# Patient Record
Sex: Male | Born: 1960 | Race: White | Hispanic: No | Marital: Married | Smoking: Former smoker
Health system: Southern US, Community
[De-identification: ages and names within clinical notes are randomized; demographics above are authoritative.]

## PROBLEM LIST (undated history)

## (undated) DIAGNOSIS — K219 Gastro-esophageal reflux disease without esophagitis: Secondary | ICD-10-CM

## (undated) DIAGNOSIS — K5190 Ulcerative colitis, unspecified, without complications: Secondary | ICD-10-CM

## (undated) DIAGNOSIS — I2510 Atherosclerotic heart disease of native coronary artery without angina pectoris: Secondary | ICD-10-CM

## (undated) DIAGNOSIS — I10 Essential (primary) hypertension: Secondary | ICD-10-CM

## (undated) HISTORY — DX: Ulcerative colitis, unspecified, without complications: K51.90

## (undated) HISTORY — PX: OTHER SURGICAL HISTORY: SHX169

## (undated) HISTORY — DX: Essential (primary) hypertension: I10

---

## 2002-01-23 ENCOUNTER — Ambulatory Visit (HOSPITAL_COMMUNITY): Admission: RE | Admit: 2002-01-23 | Discharge: 2002-01-23 | Payer: Self-pay | Admitting: Gastroenterology

## 2004-09-17 ENCOUNTER — Emergency Department (HOSPITAL_COMMUNITY): Admission: EM | Admit: 2004-09-17 | Discharge: 2004-09-17 | Payer: Self-pay | Admitting: Emergency Medicine

## 2005-06-29 ENCOUNTER — Ambulatory Visit (HOSPITAL_COMMUNITY)
Admission: RE | Admit: 2005-06-29 | Discharge: 2005-06-29 | Payer: Self-pay | Admitting: Physical Medicine and Rehabilitation

## 2013-11-27 ENCOUNTER — Other Ambulatory Visit: Payer: Self-pay | Admitting: Dermatology

## 2014-03-19 ENCOUNTER — Other Ambulatory Visit: Payer: Self-pay | Admitting: Family Medicine

## 2014-03-19 DIAGNOSIS — F17200 Nicotine dependence, unspecified, uncomplicated: Secondary | ICD-10-CM

## 2014-05-01 ENCOUNTER — Encounter: Payer: Self-pay | Admitting: Cardiology

## 2014-05-01 ENCOUNTER — Ambulatory Visit (INDEPENDENT_AMBULATORY_CARE_PROVIDER_SITE_OTHER): Payer: BC Managed Care – PPO | Admitting: Cardiology

## 2014-05-01 VITALS — BP 110/72 | HR 77 | Ht 70.0 in | Wt 247.0 lb

## 2014-05-01 DIAGNOSIS — K5190 Ulcerative colitis, unspecified, without complications: Secondary | ICD-10-CM

## 2014-05-01 DIAGNOSIS — R079 Chest pain, unspecified: Secondary | ICD-10-CM

## 2014-05-01 DIAGNOSIS — I10 Essential (primary) hypertension: Secondary | ICD-10-CM

## 2014-05-01 DIAGNOSIS — E7800 Pure hypercholesterolemia, unspecified: Secondary | ICD-10-CM

## 2014-05-01 DIAGNOSIS — E669 Obesity, unspecified: Secondary | ICD-10-CM

## 2014-05-01 NOTE — Progress Notes (Signed)
[place] [address], [address] [place], [place]  [address] Phone: [phone] Fax:  [phone]  Date:  05/01/2014   ID:  [place], DOB 07-Sep-1960, MRN [id number]  PCP:  Gennette Pac, MD   History of Present Illness: [place] is a 53 y.o. male right previously seen on 12/22/2009 with nuclear stress test that was low risk with no ischemia here for evaluation of chest pain at the request of Dr. Rex Kras. He has a multitude of cardiac risk factors including smoking, hypertension, hyperlipidemia. He has ulcerative colitis. In review of [facility] notes, he was recently dismissed from the GI practice, Dr. Cristina Gong, for noncompliance.  Previously, he was describing chest discomfort that radiated to his jaw. Also associated with some anxiety. Currently, he is describing occasional central chest pressure/tightness sometimes associated with burping but not always. When performing heavy exertional activity sometimes he feels central chest tightness. He also notes that he has been feeling some awkward dizziness. He is battled with vertigo occasionally in the past. He is concerned about this. The dizziness does not necessarily go hand in hand with chest pain. He states that he went to [employer] and recently felt some of the dizziness. He did ride the new roller coaster 3 times however.  Wt Readings from Last 3 Encounters:  05/01/14 247 lb (112.038 kg)     Past medical history: Depression and anxiety Ulcerative colitis in adenomatous colon polyps Tobacco use Hypertension Basal cell carcinoma of the face removed by Dr. Sarajane Jews in 2015 Alcohol use.  Past Surgical History  Procedure Laterality Date  . Skin cancer removal from right side of face      Current Outpatient Prescriptions  Medication Sig Dispense Refill  . Ascorbic Acid (VITAMIN C PO) Take 1 tablet by mouth daily.      Marland Kitchen atorvastatin (LIPITOR) 20 MG tablet Take 20 mg by mouth daily.      Marland Kitchen b complex vitamins capsule Take 1  capsule by mouth daily.      Marland Kitchen buPROPion (WELLBUTRIN XL) 300 MG 24 hr tablet Take 300 mg by mouth daily.      Marland Kitchen losartan-hydrochlorothiazide (HYZAAR) 50-12.5 MG per tablet Take 1 tablet by mouth daily.      . Multiple Vitamin (MULTIVITAMIN) capsule Take 1 capsule by mouth daily.      . Omega-3 Fatty Acids (OMEGA 3 PO) Take by mouth.      Marland Kitchen omeprazole (PRILOSEC) 20 MG capsule Take 20 mg by mouth daily.      . sertraline (ZOLOFT) 100 MG tablet Take 100 mg by mouth daily.       No current facility-administered medications for this visit.    Allergies:   No Known Allergies  Social History:  The patient  reports that he has quit smoking. He started smoking about 2 months ago. He does not have any smokeless tobacco history on file.  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 Family History  Problem Relation Age of Onset  . Arthritis/Rheumatoid Mother   . Liver disease Father   . Lung cancer Paternal Grandmother    His father died of pancreatic cancer status post Whipple procedure, no early family history of coronary artery disease. ROS:  Please see the history of present illness.   Denies any bleeding, syncope, fevers, chills, orthopnea, PND.   All other systems reviewed and negative.   PHYSICAL EXAM: VS:  BP 110/72  Pulse  77  Ht 5' 10"  (1.778 m)  Wt 247 lb (112.038 kg)  BMI 35.44 kg/m2 Well nourished, well developed, in no acute distress HEENT: normal, [place]/AT, EOMI Neck: no JVD, normal carotid upstroke, no bruit Cardiac:  normal S1, S2; RRR; no murmur Lungs:  clear to auscultation bilaterally, no wheezing, rhonchi or rales Abd: soft, nontender, no hepatomegaly, no bruitsoverweight Ext: no edema, 2+ distal pulses Skin: warm and dry GU: deferred Neuro: no focal abnormalities noted, AAO x 3  EKG:  03/18/14-sinus rhythm, no other abnormalities. 04/27/14-sinus rhythm, no other allergies-no change from prior Nuclear stress test: 12/27/09-no  ischemia, low risk, normal ejection fraction of 65% Labs: Hemoglobin A1c 5.4, hemoglobin 15.2, creatinine 1.4, TSH 2.2, LDL 134, triglycerides 422, previous triglycerides 126 with LDL of 127     ASSESSMENT AND PLAN:  1. Chest pain-previous evaluation was over 4 years ago. His multitude of cardiac risk factors including smoking (he states that he just quit after seeing Dr. Rex Kras), hypertension, hyperlipidemia we will proceed with nuclear stress test. 2. Hypertension-overall reasonable control. Doing well. 3. Hyperlipidemia-atorvastatin, monitored by Dr. Rex Kras. 4. Obesity-continue to encourage weight loss. 5. Ulcerative colitis-establishing with new gastroenterologist. 6. We will followup with stress test  Signed, Candee Furbish, MD [facility]  05/01/2014 10:11 AM

## 2014-05-01 NOTE — Patient Instructions (Signed)
The current medical regimen is effective;  continue present plan and medications.  Your physician has requested that you have a myoview. For further information please visit [email or web address]. Please follow instruction sheet, as given.  Follow up as needed after testing.

## 2014-05-07 ENCOUNTER — Encounter (HOSPITAL_COMMUNITY): Payer: Self-pay | Admitting: Pharmacy Technician

## 2014-05-07 ENCOUNTER — Other Ambulatory Visit: Payer: Self-pay

## 2014-05-07 ENCOUNTER — Other Ambulatory Visit (INDEPENDENT_AMBULATORY_CARE_PROVIDER_SITE_OTHER): Payer: BC Managed Care – PPO

## 2014-05-07 ENCOUNTER — Ambulatory Visit (HOSPITAL_COMMUNITY): Payer: BC Managed Care – PPO | Attending: Cardiology | Admitting: Radiology

## 2014-05-07 VITALS — BP 116/77 | Ht 70.0 in | Wt 246.0 lb

## 2014-05-07 DIAGNOSIS — R5383 Other fatigue: Secondary | ICD-10-CM

## 2014-05-07 DIAGNOSIS — I2589 Other forms of chronic ischemic heart disease: Secondary | ICD-10-CM | POA: Insufficient documentation

## 2014-05-07 DIAGNOSIS — R079 Chest pain, unspecified: Secondary | ICD-10-CM

## 2014-05-07 DIAGNOSIS — R9439 Abnormal result of other cardiovascular function study: Secondary | ICD-10-CM | POA: Diagnosis not present

## 2014-05-07 DIAGNOSIS — R5381 Other malaise: Secondary | ICD-10-CM | POA: Diagnosis not present

## 2014-05-07 DIAGNOSIS — I10 Essential (primary) hypertension: Secondary | ICD-10-CM | POA: Insufficient documentation

## 2014-05-07 DIAGNOSIS — R0609 Other forms of dyspnea: Secondary | ICD-10-CM | POA: Insufficient documentation

## 2014-05-07 DIAGNOSIS — R0989 Other specified symptoms and signs involving the circulatory and respiratory systems: Secondary | ICD-10-CM | POA: Insufficient documentation

## 2014-05-07 DIAGNOSIS — R42 Dizziness and giddiness: Secondary | ICD-10-CM | POA: Insufficient documentation

## 2014-05-07 DIAGNOSIS — R0789 Other chest pain: Secondary | ICD-10-CM | POA: Diagnosis present

## 2014-05-07 DIAGNOSIS — R0602 Shortness of breath: Secondary | ICD-10-CM

## 2014-05-07 LAB — BASIC METABOLIC PANEL
BUN: 17 mg/dL (ref 6–23)
CO2: 27 mEq/L (ref 19–32)
Calcium: 9.4 mg/dL (ref 8.4–10.5)
Chloride: 101 mEq/L (ref 96–112)
Creatinine, Ser: 1 mg/dL (ref 0.4–1.5)
GFR: 78.95 mL/min (ref >60.00)
Glucose, Bld: 96 mg/dL (ref 70–99)
Potassium: 3.9 mEq/L (ref 3.5–5.1)
Sodium: 137 mEq/L (ref 135–145)

## 2014-05-07 LAB — CBC
HCT: 43.8 % (ref 39.0–52.0)
Hemoglobin: 14.9 g/dL (ref 13.0–17.0)
MCHC: 34 g/dL (ref 30.0–36.0)
MCV: 90 fl (ref 78.0–100.0)
Platelets: 182 10*3/uL (ref 150.0–400.0)
RBC: 4.86 Mil/uL (ref 4.22–5.81)
RDW: 12.4 % (ref 11.5–15.5)
WBC: 7.5 10*3/uL (ref 4.0–10.5)

## 2014-05-07 LAB — PROTIME-INR
INR: 1 ratio (ref 0.8–1.0)
Prothrombin Time: 10.6 s (ref 9.6–13.1)

## 2014-05-07 MED ORDER — TECHNETIUM TC 99M SESTAMIBI GENERIC - CARDIOLITE
10.0000 | Freq: Once | INTRAVENOUS | Status: AC | PRN
  Administered 2014-05-07: 10 via INTRAVENOUS

## 2014-05-07 MED ORDER — TECHNETIUM TC 99M SESTAMIBI GENERIC - CARDIOLITE
30.0000 | Freq: Once | INTRAVENOUS | Status: AC | PRN
  Administered 2014-05-07: 30 via INTRAVENOUS

## 2014-05-07 NOTE — Progress Notes (Signed)
[facility] [address] [place], [place] [address] [phone]    Cardiology Nuclear Med Study  [place] is a 53 y.o. male     MRN : [id number]     DOB: [birth year]  Procedure Date: 05/07/2014  Nuclear Med Background Indication for Stress Test:  Evaluation for Ischemia History:  Previous Nuclear Study @ [facility] (low risk) Cardiac Risk Factors: Hypertension  Symptoms:  Chest Pressure.  (last date of chest discomfort 5 days ago), Dizziness, DOE and Fatigue   Nuclear Pre-Procedure Caffeine/Decaff Intake:  None NPO After: 7:00pm   Lungs:  clear O2 Sat: 94% on room air. IV 0.9% NS with Angio Cath:  22g  IV Site: R Hand  IV Started by:  Crissie Figures, RN  Chest Size (in):  48 Cup Size: n/a  Height: 5' 10"  (1.778 m)  Weight:  246 lb (111.585 kg)  BMI:  Body mass index is 35.3 kg/(m^2). Tech Comments:  N/A    Nuclear Med Study 1 or 2 day study: 1 day  Stress Test Type:  Stress  Reading MD: N/A  Order Authorizing Provider:  Candee Furbish, MD  Resting Radionuclide: Technetium 33mSestamibi  Resting Radionuclide Dose: 11.0 mCi   Stress Radionuclide:  Technetium 927mestamibi  Stress Radionuclide Dose: 33.0 mCi           Stress Protocol Rest HR: 67 Stress HR: 148  Rest BP: 116/77 Stress BP: 152/74  Exercise Time (min): 7:30 METS: 9.30   Predicted Max HR: 166 bpm % Max HR: 89.16 bpm Rate Pressure Product: 22496   Dose of Adenosine (mg):  n/a Dose of Lexiscan: n/a mg  Dose of Atropine (mg): n/a Dose of Dobutamine: n/a mcg/kg/min (at max HR)  Stress Test Technologist: TeIleene HutchinsonEMT-P  Nuclear Technologist:  ToAnnye RuskCNMT     Rest Procedure:  Myocardial perfusion imaging was performed at rest 45 minutes following the intravenous administration of Technetium 9956mstamibi. Rest ECG: NSR - Normal EKG  Stress Procedure:  The patient exercised on the treadmill utilizing the Bruce Protocol for 7:30 minutes. The patient stopped due to  sob/fatigue. Pt had epigastric discomfort/heaviness. During recovery, pt stated he did experience jaw pain. All symptoms subsided within minutes of recovery.  Technetium 71m95mtamibi was injected at peak exercise and myocardial perfusion imaging was performed after a brief delay. Stress ECG: There is ST segment depression in the inferior leads as well as lateral precordial leads of 1-2 mm. Abnormal. Elevation in aVR.  QPS Raw Data Images:  Normal; no motion artifact; normal heart/lung ratio. Stress Images:  There is a moderate sized defect along the mid to distal anteroseptal wall/apical distribution seen at stress, normal at rest consistent with ischemia. Defect is moderate in intensity. Rest Images:  Normal homogeneous uptake in all areas of the myocardium. Subtraction (SDS):  12 Transient Ischemic Dilatation (Normal <1.22):  1.14 Lung/Heart Ratio (Normal <0.45):  0.49  Quantitative Gated Spect Images QGS EDV:  106 ml QGS ESV:  54 ml  Impression Exercise Capacity:  Good exercise capacity. BP Response:  Normal blood pressure response. Clinical Symptoms:  Chest pain, shortness of breath, heaviness with associated jaw pain at peak stress slowly becoming symptom-free during recovery. ECG Impression:  Significant ST abnormalities consistent with ischemia. Comparison with Prior Nuclear Study: No images to compare  Overall Impression:  High risk stress nuclear study With anteroseptal wall ischemia concerning for disease in his left anterior descending artery, mildly reduced ejection  fraction..  LV Ejection Fraction: 49%.  LV Wall Motion:  Hypokinesis/dyskinesis of the apical/inferoapical/distal anteroseptal region.  Findings have been discussed with patient, proceeding with heart catheterization. Orders for catheterization have been placed.  Candee Furbish, MD

## 2014-05-08 ENCOUNTER — Encounter (HOSPITAL_COMMUNITY): Payer: Self-pay | Admitting: General Practice

## 2014-05-08 ENCOUNTER — Ambulatory Visit (HOSPITAL_COMMUNITY)
Admission: RE | Admit: 2014-05-08 | Discharge: 2014-05-09 | Disposition: A | Discharge status: Home / Self Care | Payer: BC Managed Care – PPO | Source: Ambulatory Visit | Attending: Interventional Cardiology | Admitting: Interventional Cardiology

## 2014-05-08 ENCOUNTER — Encounter (HOSPITAL_COMMUNITY)
Admission: RE | Disposition: A | Discharge status: Home / Self Care | Payer: BC Managed Care – PPO | Source: Ambulatory Visit | Attending: Interventional Cardiology

## 2014-05-08 DIAGNOSIS — Z6835 Body mass index (BMI) 35.0-35.9, adult: Secondary | ICD-10-CM | POA: Diagnosis not present

## 2014-05-08 DIAGNOSIS — F3289 Other specified depressive episodes: Secondary | ICD-10-CM | POA: Diagnosis not present

## 2014-05-08 DIAGNOSIS — Z85828 Personal history of other malignant neoplasm of skin: Secondary | ICD-10-CM | POA: Insufficient documentation

## 2014-05-08 DIAGNOSIS — E669 Obesity, unspecified: Secondary | ICD-10-CM | POA: Diagnosis not present

## 2014-05-08 DIAGNOSIS — I200 Unstable angina: Secondary | ICD-10-CM | POA: Diagnosis not present

## 2014-05-08 DIAGNOSIS — Z955 Presence of coronary angioplasty implant and graft: Secondary | ICD-10-CM

## 2014-05-08 DIAGNOSIS — I25119 Atherosclerotic heart disease of native coronary artery with unspecified angina pectoris: Secondary | ICD-10-CM

## 2014-05-08 DIAGNOSIS — I2510 Atherosclerotic heart disease of native coronary artery without angina pectoris: Secondary | ICD-10-CM | POA: Diagnosis not present

## 2014-05-08 DIAGNOSIS — E7800 Pure hypercholesterolemia, unspecified: Secondary | ICD-10-CM | POA: Diagnosis present

## 2014-05-08 DIAGNOSIS — E785 Hyperlipidemia, unspecified: Secondary | ICD-10-CM | POA: Insufficient documentation

## 2014-05-08 DIAGNOSIS — F411 Generalized anxiety disorder: Secondary | ICD-10-CM | POA: Diagnosis not present

## 2014-05-08 DIAGNOSIS — I10 Essential (primary) hypertension: Secondary | ICD-10-CM | POA: Diagnosis present

## 2014-05-08 DIAGNOSIS — R079 Chest pain, unspecified: Secondary | ICD-10-CM | POA: Diagnosis present

## 2014-05-08 DIAGNOSIS — Z23 Encounter for immunization: Secondary | ICD-10-CM | POA: Insufficient documentation

## 2014-05-08 DIAGNOSIS — F17200 Nicotine dependence, unspecified, uncomplicated: Secondary | ICD-10-CM | POA: Diagnosis not present

## 2014-05-08 DIAGNOSIS — K5190 Ulcerative colitis, unspecified, without complications: Secondary | ICD-10-CM | POA: Insufficient documentation

## 2014-05-08 DIAGNOSIS — I209 Angina pectoris, unspecified: Secondary | ICD-10-CM | POA: Diagnosis present

## 2014-05-08 DIAGNOSIS — F329 Major depressive disorder, single episode, unspecified: Secondary | ICD-10-CM | POA: Diagnosis not present

## 2014-05-08 HISTORY — PX: CORONARY STENT PLACEMENT: SHX1402

## 2014-05-08 HISTORY — PX: LEFT HEART CATHETERIZATION WITH CORONARY ANGIOGRAM: SHX5451

## 2014-05-08 HISTORY — PX: PERCUTANEOUS CORONARY STENT INTERVENTION (PCI-S): SHX5485

## 2014-05-08 HISTORY — DX: Atherosclerotic heart disease of native coronary artery without angina pectoris: I25.10

## 2014-05-08 HISTORY — DX: Gastro-esophageal reflux disease without esophagitis: K21.9

## 2014-05-08 LAB — CK TOTAL AND CKMB (NOT AT ARMC)
CK, MB: 2.4 ng/mL (ref 0.3–4.0)
Relative Index: 1.9 (ref 0.0–2.5)
Total CK: 126 U/L (ref 7–232)

## 2014-05-08 SURGERY — LEFT HEART CATHETERIZATION WITH CORONARY ANGIOGRAM
Anesthesia: LOCAL

## 2014-05-08 MED ORDER — ADULT MULTIVITAMIN W/MINERALS CH
1.0000 | ORAL_TABLET | Freq: Every day | ORAL | Status: DC
  Administered 2014-05-09: 09:00:00 1 via ORAL
  Filled 2014-05-08: qty 1

## 2014-05-08 MED ORDER — LOSARTAN POTASSIUM-HCTZ 50-12.5 MG PO TABS: 1.0000 | ORAL_TABLET | Freq: Every day | ORAL | Status: DC

## 2014-05-08 MED ORDER — NITROGLYCERIN 1 MG/10 ML FOR IR/CATH LAB
INTRA_ARTERIAL | Status: AC
  Filled 2014-05-08: qty 10

## 2014-05-08 MED ORDER — SERTRALINE HCL 100 MG PO TABS
100.0000 mg | ORAL_TABLET | Freq: Two times a day (BID) | ORAL | Status: DC
Start: 2014-05-08 — End: 2014-05-09
  Administered 2014-05-08 – 2014-05-09 (×2): 100 mg via ORAL
  Filled 2014-05-08 (×3): qty 1

## 2014-05-08 MED ORDER — HEPARIN (PORCINE) IN NACL 2-0.9 UNIT/ML-% IJ SOLN
INTRAMUSCULAR | Status: AC
  Filled 2014-05-08: qty 1000

## 2014-05-08 MED ORDER — ASPIRIN 81 MG PO CHEW
81.0000 mg | CHEWABLE_TABLET | Freq: Every day | ORAL | Status: DC
  Administered 2014-05-09: 09:00:00 81 mg via ORAL
  Filled 2014-05-08: qty 1

## 2014-05-08 MED ORDER — SODIUM CHLORIDE 0.9 % IV SOLN
INTRAVENOUS | Status: DC
  Administered 2014-05-08: 10:00:00 via INTRAVENOUS

## 2014-05-08 MED ORDER — TICAGRELOR 90 MG PO TABS
ORAL_TABLET | ORAL | Status: AC
Start: 2014-05-08 — End: 2014-05-08
  Filled 2014-05-08: qty 2

## 2014-05-08 MED ORDER — PANTOPRAZOLE SODIUM 40 MG PO TBEC
40.0000 mg | DELAYED_RELEASE_TABLET | Freq: Every day | ORAL | Status: DC
  Administered 2014-05-09: 40 mg via ORAL
  Filled 2014-05-08 (×2): qty 1

## 2014-05-08 MED ORDER — BUPROPION HCL ER (XL) 300 MG PO TB24
300.0000 mg | ORAL_TABLET | Freq: Every day | ORAL | Status: DC
  Administered 2014-05-09: 09:00:00 300 mg via ORAL
  Filled 2014-05-08: qty 1

## 2014-05-08 MED ORDER — MULTIVITAMINS PO CAPS: 1.0000 | ORAL_CAPSULE | Freq: Every day | ORAL | Status: DC

## 2014-05-08 MED ORDER — LOSARTAN POTASSIUM 50 MG PO TABS
50.0000 mg | ORAL_TABLET | Freq: Every day | ORAL | Status: DC
  Administered 2014-05-09: 09:00:00 50 mg via ORAL
  Filled 2014-05-08 (×2): qty 1

## 2014-05-08 MED ORDER — BIVALIRUDIN 250 MG IV SOLR
INTRAVENOUS | Status: AC
  Filled 2014-05-08: qty 250

## 2014-05-08 MED ORDER — SODIUM CHLORIDE 0.9 % IV SOLN: 1.0000 mL/kg/h | INTRAVENOUS | Status: AC

## 2014-05-08 MED ORDER — ASPIRIN 81 MG PO CHEW
CHEWABLE_TABLET | ORAL | Status: AC
  Administered 2014-05-08: 81 mg via ORAL
  Filled 2014-05-08: qty 1

## 2014-05-08 MED ORDER — MIDAZOLAM HCL 2 MG/2ML IJ SOLN
INTRAMUSCULAR | Status: AC
Start: 2014-05-08 — End: 2014-05-08
  Filled 2014-05-08: qty 2

## 2014-05-08 MED ORDER — ATORVASTATIN CALCIUM 40 MG PO TABS
40.0000 mg | ORAL_TABLET | Freq: Every day | ORAL | Status: DC
  Administered 2014-05-09: 40 mg via ORAL
  Filled 2014-05-08: qty 1

## 2014-05-08 MED ORDER — ACETAMINOPHEN 325 MG PO TABS: 650.0000 mg | ORAL_TABLET | ORAL | Status: DC | PRN

## 2014-05-08 MED ORDER — FENTANYL CITRATE 0.05 MG/ML IJ SOLN
INTRAMUSCULAR | Status: AC
  Filled 2014-05-08: qty 2

## 2014-05-08 MED ORDER — ASPIRIN 81 MG PO CHEW
81.0000 mg | CHEWABLE_TABLET | ORAL | Status: AC
  Administered 2014-05-08: 81 mg via ORAL

## 2014-05-08 MED ORDER — OMEGA 3 1000 MG PO CAPS: 1000.0000 mg | ORAL_CAPSULE | Freq: Every day | ORAL | Status: DC

## 2014-05-08 MED ORDER — TICAGRELOR 90 MG PO TABS
90.0000 mg | ORAL_TABLET | Freq: Two times a day (BID) | ORAL | Status: DC
  Administered 2014-05-08 – 2014-05-09 (×2): 90 mg via ORAL
  Filled 2014-05-08 (×3): qty 1

## 2014-05-08 MED ORDER — SODIUM CHLORIDE 0.9 % IJ SOLN: 3.0000 mL | INTRAMUSCULAR | Status: DC | PRN

## 2014-05-08 MED ORDER — PNEUMOCOCCAL VAC POLYVALENT 25 MCG/0.5ML IJ INJ
0.5000 mL | INJECTION | Freq: Once | INTRAMUSCULAR | Status: AC
Start: 2014-05-08 — End: 2014-05-09
  Administered 2014-05-09: 0.5 mL via INTRAMUSCULAR
  Filled 2014-05-08: qty 0.5

## 2014-05-08 MED ORDER — MIDAZOLAM HCL 2 MG/2ML IJ SOLN
INTRAMUSCULAR | Status: AC
  Filled 2014-05-08: qty 2

## 2014-05-08 MED ORDER — SODIUM CHLORIDE 0.9 % IJ SOLN: 3.0000 mL | Freq: Two times a day (BID) | INTRAMUSCULAR | Status: DC

## 2014-05-08 MED ORDER — HYDROCHLOROTHIAZIDE 12.5 MG PO CAPS
12.5000 mg | ORAL_CAPSULE | Freq: Every day | ORAL | Status: DC
  Administered 2014-05-09: 09:00:00 12.5 mg via ORAL
  Filled 2014-05-08 (×2): qty 1

## 2014-05-08 MED ORDER — HEPARIN SODIUM (PORCINE) 1000 UNIT/ML IJ SOLN
INTRAMUSCULAR | Status: AC
  Filled 2014-05-08: qty 1

## 2014-05-08 MED ORDER — OMEGA-3-ACID ETHYL ESTERS 1 G PO CAPS
1.0000 g | ORAL_CAPSULE | Freq: Every day | ORAL | Status: DC
Start: 2014-05-08 — End: 2014-05-09
  Administered 2014-05-09: 1 g via ORAL
  Filled 2014-05-08 (×2): qty 1

## 2014-05-08 MED ORDER — VERAPAMIL HCL 2.5 MG/ML IV SOLN
INTRAVENOUS | Status: AC
  Filled 2014-05-08: qty 2

## 2014-05-08 MED ORDER — ONDANSETRON HCL 4 MG/2ML IJ SOLN: 4.0000 mg | Freq: Four times a day (QID) | INTRAMUSCULAR | Status: DC | PRN

## 2014-05-08 MED ORDER — SODIUM CHLORIDE 0.9 % IV SOLN: 250.0000 mL | INTRAVENOUS | Status: DC | PRN

## 2014-05-08 MED ORDER — LIDOCAINE HCL (PF) 1 % IJ SOLN
INTRAMUSCULAR | Status: AC
  Filled 2014-05-08: qty 30

## 2014-05-08 NOTE — Progress Notes (Signed)
TR BAND REMOVAL  LOCATION:  right radial  DEFLATED PER PROTOCOL:  Yes.    TIME BAND OFF / DRESSING APPLIED:   1615   SITE UPON ARRIVAL:   Level 0  SITE AFTER BAND REMOVAL:  Level 0  REVERSE ALLEN'S TEST:    positive  CIRCULATION SENSATION AND MOVEMENT:  Within Normal Limits  Yes.    COMMENTS:

## 2014-05-08 NOTE — Interval H&P Note (Signed)
History and Physical Interval Note:  05/08/2014 11:05 AM  [place]  has presented today for surgery, with the diagnosis of cp/abnormal mioview  The various methods of treatment have been discussed with the patient and family. After consideration of risks, benefits and other options for treatment, the patient has consented to  Procedure(s): LEFT HEART CATHETERIZATION WITH CORONARY ANGIOGRAM (N/A) as a surgical intervention .  The patient's history has been reviewed, patient examined, no change in status, stable for surgery.  I have reviewed the patient's chart and labs.  Questions were answered to the patient's satisfaction.     SKAINS, MARK

## 2014-05-08 NOTE — H&P (View-Only) (Signed)
[facility] [address] [place], [place] [address] [phone]    Cardiology Nuclear Med Study  [place] is a 53 y.o. male     MRN : [id number]     DOB: [birth year]  Procedure Date: 05/07/2014  Nuclear Med Background Indication for Stress Test:  Evaluation for Ischemia History:  Previous Nuclear Study @ [facility] (low risk) Cardiac Risk Factors: Hypertension  Symptoms:  Chest Pressure.  (last date of chest discomfort 5 days ago), Dizziness, DOE and Fatigue   Nuclear Pre-Procedure Caffeine/Decaff Intake:  None NPO After: 7:00pm   Lungs:  clear O2 Sat: 94% on room air. IV 0.9% NS with Angio Cath:  22g  IV Site: R Hand  IV Started by:  Crissie Figures, RN  Chest Size (in):  48 Cup Size: n/a  Height: 5' 10"  (1.778 m)  Weight:  246 lb (111.585 kg)  BMI:  Body mass index is 35.3 kg/(m^2). Tech Comments:  N/A    Nuclear Med Study 1 or 2 day study: 1 day  Stress Test Type:  Stress  Reading MD: N/A  Order Authorizing Provider:  Candee Furbish, MD  Resting Radionuclide: Technetium 103mSestamibi  Resting Radionuclide Dose: 11.0 mCi   Stress Radionuclide:  Technetium 941mestamibi  Stress Radionuclide Dose: 33.0 mCi           Stress Protocol Rest HR: 67 Stress HR: 148  Rest BP: 116/77 Stress BP: 152/74  Exercise Time (min): 7:30 METS: 9.30   Predicted Max HR: 166 bpm % Max HR: 89.16 bpm Rate Pressure Product: 22496   Dose of Adenosine (mg):  n/a Dose of Lexiscan: n/a mg  Dose of Atropine (mg): n/a Dose of Dobutamine: n/a mcg/kg/min (at max HR)  Stress Test Technologist: TeIleene HutchinsonEMT-P  Nuclear Technologist:  ToAnnye RuskCNMT     Rest Procedure:  Myocardial perfusion imaging was performed at rest 45 minutes following the intravenous administration of Technetium 9978mstamibi. Rest ECG: NSR - Normal EKG  Stress Procedure:  The patient exercised on the treadmill utilizing the Bruce Protocol for 7:30 minutes. The patient stopped due to  sob/fatigue. Pt had epigastric discomfort/heaviness. During recovery, pt stated he did experience jaw pain. All symptoms subsided within minutes of recovery.  Technetium 4m9mtamibi was injected at peak exercise and myocardial perfusion imaging was performed after a brief delay. Stress ECG: There is ST segment depression in the inferior leads as well as lateral precordial leads of 1-2 mm. Abnormal. Elevation in aVR.  QPS Raw Data Images:  Normal; no motion artifact; normal heart/lung ratio. Stress Images:  There is a moderate sized defect along the mid to distal anteroseptal wall/apical distribution seen at stress, normal at rest consistent with ischemia. Defect is moderate in intensity. Rest Images:  Normal homogeneous uptake in all areas of the myocardium. Subtraction (SDS):  12 Transient Ischemic Dilatation (Normal <1.22):  1.14 Lung/Heart Ratio (Normal <0.45):  0.49  Quantitative Gated Spect Images QGS EDV:  106 ml QGS ESV:  54 ml  Impression Exercise Capacity:  Good exercise capacity. BP Response:  Normal blood pressure response. Clinical Symptoms:  Chest pain, shortness of breath, heaviness with associated jaw pain at peak stress slowly becoming symptom-free during recovery. ECG Impression:  Significant ST abnormalities consistent with ischemia. Comparison with Prior Nuclear Study: No images to compare  Overall Impression:  High risk stress nuclear study With anteroseptal wall ischemia concerning for disease in his left anterior descending artery, mildly reduced ejection  fraction..  LV Ejection Fraction: 49%.  LV Wall Motion:  Hypokinesis/dyskinesis of the apical/inferoapical/distal anteroseptal region.  Findings have been discussed with patient, proceeding with heart catheterization. Orders for catheterization have been placed.  Candee Furbish, MD

## 2014-05-08 NOTE — CV Procedure (Signed)
    CARDIAC CATH NOTE  Name: ADARRIUS GRAEFF MRN: [id number] DOB: 1961/02/21  Procedure: PTCA and stenting of the proximal LAD  Indication: 53 yo WM with recent chest pain and abnormal stress test. Diagnostic study showed 90-95% proximal LAD stenosis.  Procedural Details: The right wrist access was used. Weight-based bivalirudin was given for anticoagulation. Brilinta 180 mg was given orally. Once a therapeutic ACT was achieved, a 6 [place] XBLAD 3.5 guide catheter was inserted.  A prowater coronary guidewire was used to cross the lesion.  The lesion was predilated with a 2.5 mm balloon. The patient was enrolled in the Bioflow study and randomized to a Xience stent.  The lesion was then stented with a 3.0 x 23 mm Xience stent.  The stent was postdilated with a 3.25 mm noncompliant balloon.  Following PCI, there was 0% residual stenosis and TIMI-3 flow. Final angiography confirmed an excellent result. The patient tolerated the procedure well. There were no immediate procedural complications. A TR band was used for radial hemostasis. The patient was transferred to the post catheterization recovery area for further monitoring.  Lesion Data: Vessel: LAD- proximal. Percent stenosis (pre): 90-95% TIMI-flow (pre):  3 Stent:  3.0 x 23 mm Xience Percent stenosis (post): 0% TIMI-flow (post): 3  Conclusions:  1. Successful stenting of the proximal LAD with a DES.  Recommendations: Continue DAPT for one year. Anticipate DC in am.  Rik Wadel [place], [place] 05/08/2014, 12:34 PM

## 2014-05-08 NOTE — Research (Signed)
BIO-FLOW Informed Consent   Subject Name: [place]  Subject met inclusion and exclusion criteria.  The informed consent form, study requirements and expectations were reviewed with the subject and questions and concerns were addressed prior to the signing of the consent form.  The subject verbalized understanding of the trial requirements.  The subject agreed to participate in the BIO-FLOW trial and signed the informed consent.  The informed consent was obtained prior to performance of any protocol-specific procedures for the subject.  A copy of the signed informed consent was given to the subject and a copy was placed in the subject's medical record.  [name] 05/08/2014, 2:06 PM

## 2014-05-08 NOTE — CV Procedure (Signed)
    CARDIAC CATHETERIZATION  PROCEDURE:  Left heart catheterization with selective coronary angiography, left ventriculogram via the radial artery approach.  INDICATIONS:  53 year old nondiabetic male with obesity, hypertension, hyperlipidemia with significant anginal symptoms and nuclear stress test that was high risk demonstrating ejection fraction of 49% and anterior wall reversible defect consistent with ischemia.  The risks, benefits, and details of the procedure were explained to the patient, including possibilities of stroke, heart attack, death, renal impairment, arterial damage, bleeding.  The patient verbalized understanding and wanted to proceed.  Informed written consent was obtained.  PROCEDURE TECHNIQUE:  Allen's test was performed pre-and post procedure and was normal. The right radial artery site was prepped and draped in a sterile fashion. One percent lidocaine was used for local anesthesia. Using the modified Seldinger technique a 5 French hydrophilic sheath was inserted into the radial artery without difficulty. 3 mg of verapamil was administered via the sheath. There was mild difficulty passing the safety J-wire just distal to the sheath insertion. An angiogram was performed. A Judkins right #4 catheter with the guidance of a Versicore wire was placed in the right coronary cusp and selectively cannulated the right coronary artery. This was passed without difficulty. After traversing the aortic arch, 5000 units of heparin IV was administered. A Judkins left #3.5 catheter was used to selectively cannulate the left main artery. Multiple views with hand injection of Omnipaque were obtained. Catheter a pigtail catheter was used to cross into the left ventricle, hemodynamics were obtained, and a left ventriculogram was performed in the RAO position with power injection. Following the procedure, sheath was removed, patient was hemodynamically stable, hemostasis was maintained with a Terumo T  band.   CONTRAST:  Total of 80 ml.    FLOUROSCOPY TIME: 3.6 min.  COMPLICATIONS:  None.    HEMODYNAMICS:  Aortic pressure was 626/94WNIO; LV systolic pressure was 270JJKK; LVEDP 56mHg.  There was no gradient between the left ventricle and aorta.    ANGIOGRAPHIC DATA:    Left main: No angiographically significant coronary artery disease branches into LAD and circumflex artery  Left anterior descending (LAD): There is proximal LAD disease a stenosis of 95% long, tubular lesion encompassing the first septal branch just up to the first major diagonal branch.  Circumflex artery (CIRC): Small ramus branch minor luminal irregularities. Circumflex artery with one obtuse marginal branch, no significant disease.  Right coronary artery (RCA): Dominant vessel giving rise to the posterior descending artery, no angiographically significant disease.  LEFT VENTRICULOGRAM:  Left ventricular angiogram was done in the 30 RAO projection and revealed mid to distal anterior wall hypokinesis with an estimated ejection fraction of 45%. 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  LVEDP 17 mmHg.  Ejection fraction 45%.  RECOMMENDATION:  Percutaneous intervention to be performed by Dr. Peter JMartinique Case discussed with patient. Understands risks and benefits of procedure including stroke, heart attack, death, renal impairment. He is currently participating in Bio-Flow study.  Post procedure, we will start beta blocker, continue with statin, continue with angiotensin receptor blocker.  SCandee Furbish MD

## 2014-05-08 NOTE — Interval H&P Note (Signed)
History and Physical Interval Note:  05/08/2014 11:04 AM  [place]  has presented today for surgery, with the diagnosis of cp/abnormal mioview  The various methods of treatment have been discussed with the patient and family. After consideration of risks, benefits and other options for treatment, the patient has consented to  Procedure(s): LEFT HEART CATHETERIZATION WITH CORONARY ANGIOGRAM (N/A) as a surgical intervention .  The patient's history has been reviewed, patient examined, no change in status, stable for surgery.  I have reviewed the patient's chart and labs.  Questions were answered to the patient's satisfaction.     [name]

## 2014-05-08 NOTE — H&P (View-Only) (Signed)
[facility]. [address] [place], [place]  [address] Phone: [phone] Fax:  [phone]  Date:  05/01/2014   ID:  Howard Lawrence, DOB 10-01-1960, MRN [id number]  PCP:  Gennette Pac, MD   History of Present Illness: Howard Lawrence is a 53 y.o. male right previously seen on 12/22/2009 with nuclear stress test that was low risk with no ischemia here for evaluation of chest pain at the request of Dr. Rex Kras. He has a multitude of cardiac risk factors including smoking, hypertension, hyperlipidemia. He has ulcerative colitis. In review of [facility] notes, he was recently dismissed from the GI practice, Dr. Cristina Gong, for noncompliance.  Previously, he was describing chest discomfort that radiated to his jaw. Also associated with some anxiety. Currently, he is describing occasional central chest pressure/tightness sometimes associated with burping but not always. When performing heavy exertional activity sometimes he feels central chest tightness. He also notes that he has been feeling some awkward dizziness. He is battled with vertigo occasionally in the past. He is concerned about this. The dizziness does not necessarily go hand in hand with chest pain. He states that he went to [place] and recently felt some of the dizziness. He did ride the new roller coaster 3 times however.  Wt Readings from Last 3 Encounters:  05/01/14 247 lb (112.038 kg)     Past medical history: Depression and anxiety Ulcerative colitis in adenomatous colon polyps Tobacco use Hypertension Basal cell carcinoma of the face removed by Dr. Sarajane Jews in 2015 Alcohol use.  Past Surgical History  Procedure Laterality Date  . Skin cancer removal from right side of face      Current Outpatient Prescriptions  Medication Sig Dispense Refill  . Ascorbic Acid (VITAMIN C PO) Take 1 tablet by mouth daily.      Marland Kitchen atorvastatin (LIPITOR) 20 MG tablet Take 20 mg by mouth daily.      Marland Kitchen b complex vitamins capsule Take 1  capsule by mouth daily.      Marland Kitchen buPROPion (WELLBUTRIN XL) 300 MG 24 hr tablet Take 300 mg by mouth daily.      Marland Kitchen losartan-hydrochlorothiazide (HYZAAR) 50-12.5 MG per tablet Take 1 tablet by mouth daily.      . Multiple Vitamin (MULTIVITAMIN) capsule Take 1 capsule by mouth daily.      . Omega-3 Fatty Acids (OMEGA 3 PO) Take by mouth.      Marland Kitchen omeprazole (PRILOSEC) 20 MG capsule Take 20 mg by mouth daily.      . sertraline (ZOLOFT) 100 MG tablet Take 100 mg by mouth daily.       No current facility-administered medications for this visit.    Allergies:   No Known Allergies  Social History:  The patient  reports that he has quit smoking. He started smoking about 2 months ago. He does not have any smokeless tobacco history on file.  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 Family History  Problem Relation Age of Onset  . Arthritis/Rheumatoid Mother   . Liver disease Father   . Lung cancer Paternal Grandmother    His father died of pancreatic cancer status post Whipple procedure, no early family history of coronary artery disease. ROS:  Please see the history of present illness.   Denies any bleeding, syncope, fevers, chills, orthopnea, PND.   All other systems reviewed and negative.   PHYSICAL EXAM: VS:  BP 110/72  Pulse  77  Ht 5' 10"  (1.778 m)  Wt 247 lb (112.038 kg)  BMI 35.44 kg/m2 Well nourished, well developed, in no acute distress HEENT: normal, Enhaut/AT, EOMI Neck: no JVD, normal carotid upstroke, no bruit Cardiac:  normal S1, S2; RRR; no murmur Lungs:  clear to auscultation bilaterally, no wheezing, rhonchi or rales Abd: soft, nontender, no hepatomegaly, no bruitsoverweight Ext: no edema, 2+ distal pulses Skin: warm and dry GU: deferred Neuro: no focal abnormalities noted, AAO x 3  EKG:  03/18/14-sinus rhythm, no other abnormalities. 04/27/14-sinus rhythm, no other allergies-no change from prior Nuclear stress test: 12/27/09-no  ischemia, low risk, normal ejection fraction of 65% Labs: Hemoglobin A1c 5.4, hemoglobin 15.2, creatinine 1.4, TSH 2.2, LDL 134, triglycerides 422, previous triglycerides 126 with LDL of 127     ASSESSMENT AND PLAN:  1. Chest pain-previous evaluation was over 4 years ago. His multitude of cardiac risk factors including smoking (he states that he just quit after seeing Dr. Rex Kras), hypertension, hyperlipidemia we will proceed with nuclear stress test. 2. Hypertension-overall reasonable control. Doing well. 3. Hyperlipidemia-atorvastatin, monitored by Dr. Rex Kras. 4. Obesity-continue to encourage weight loss. 5. Ulcerative colitis-establishing with new gastroenterologist. 6. We will followup with stress test  Signed, Candee Furbish, MD [facility]  05/01/2014 10:11 AM

## 2014-05-09 DIAGNOSIS — E7800 Pure hypercholesterolemia, unspecified: Secondary | ICD-10-CM | POA: Diagnosis not present

## 2014-05-09 DIAGNOSIS — Z23 Encounter for immunization: Secondary | ICD-10-CM | POA: Diagnosis not present

## 2014-05-09 DIAGNOSIS — I2510 Atherosclerotic heart disease of native coronary artery without angina pectoris: Secondary | ICD-10-CM | POA: Diagnosis not present

## 2014-05-09 DIAGNOSIS — R079 Chest pain, unspecified: Secondary | ICD-10-CM

## 2014-05-09 DIAGNOSIS — I10 Essential (primary) hypertension: Secondary | ICD-10-CM | POA: Diagnosis not present

## 2014-05-09 LAB — CK TOTAL AND CKMB (NOT AT ARMC)
CK, MB: 3.1 ng/mL (ref 0.3–4.0)
RELATIVE INDEX: 3 — AB (ref 0.0–2.5)
Total CK: 104 U/L (ref 7–232)

## 2014-05-09 LAB — BASIC METABOLIC PANEL
Anion gap: 13 (ref 5–15)
BUN: 16 mg/dL (ref 6–23)
CHLORIDE: 102 meq/L (ref 96–112)
CO2: 26 mEq/L (ref 19–32)
Calcium: 8.9 mg/dL (ref 8.4–10.5)
Creatinine, Ser: 0.97 mg/dL (ref 0.50–1.35)
GFR calc non Af Amer: >90 mL/min (ref >90)
GLUCOSE: 99 mg/dL (ref 70–99)
POTASSIUM: 3.6 meq/L — AB (ref 3.7–5.3)
Sodium: 141 mEq/L (ref 137–147)

## 2014-05-09 LAB — CBC
HCT: 42.7 % (ref 39.0–52.0)
Hemoglobin: 14.8 g/dL (ref 13.0–17.0)
MCH: 30.8 pg (ref 26.0–34.0)
MCHC: 34.7 g/dL (ref 30.0–36.0)
MCV: 88.8 fL (ref 78.0–100.0)
Platelets: 156 10*3/uL (ref 150–400)
RBC: 4.81 MIL/uL (ref 4.22–5.81)
RDW: 12.3 % (ref 11.5–15.5)
WBC: 7.4 10*3/uL (ref 4.0–10.5)

## 2014-05-09 MED ORDER — TICAGRELOR 90 MG PO TABS: 90.0000 mg | ORAL_TABLET | Freq: Two times a day (BID) | ORAL | Status: DC

## 2014-05-09 MED ORDER — ASPIRIN 81 MG PO CHEW: 81.0000 mg | CHEWABLE_TABLET | Freq: Every day | ORAL | Status: DC

## 2014-05-09 NOTE — Discharge Summary (Signed)
Physician Discharge Summary      Patient ID: MAHAMADOU WELTZ MRN: [id number] DOB/AGE: 1960-09-22 53 y.o.  Admit date: 05/08/2014 Discharge date: 05/09/2014  Admission Diagnoses:  Discharge Diagnoses:  Active Problems:   Chest pain on exertion   Essential hypertension, benign   Pure hypercholesterolemia   Obesity, unspecified   Coronary atherosclerosis of native coronary artery   Angina pectoris, crescendo   Discharged Condition: stable  Hospital Course:   FANNIE ALOMAR is a 53 y.o. male right previously seen on 12/22/2009 with nuclear stress test that was low risk with no ischemia here for evaluation of chest pain at the request of Dr. Rex Kras. He has a multitude of cardiac risk factors including smoking, hypertension, hyperlipidemia. He has ulcerative colitis. In review of [facility] notes, he was recently dismissed from the GI practice, Dr. Cristina Gong, for noncompliance.   Previously, he was describing chest discomfort that radiated to his jaw. Also associated with some anxiety.  Currently, he is describing occasional central chest pressure/tightness sometimes associated with burping but not always. When performing heavy exertional activity sometimes he feels central chest tightness. He also notes that he has been feeling some awkward dizziness. He is battled with vertigo occasionally in the past. He is concerned about this. The dizziness does not necessarily go hand in hand with chest pain.  He states that he went to [place] and recently felt some of the dizziness. He did ride the new roller coaster 3 times however.  He was admitted for [facility] after having a high-risk Nuc.  This resulted in PTCA and stenting(DES) of the proximal LAD(90-95% stenosis).  ASA and Brilinta were started.  Consider adding BB at follow up if BP and HR are adequate.  He was seen by cardiac rehab.  Lifestyle modification was discussed. The patient was seen by Dr. Caryl Comes who felt he was stable for DC home.    Consults:  None  Significant Diagnostic Studies:  CARDIAC CATHETERIZATION  PROCEDURE: Left heart catheterization with selective coronary angiography, left ventriculogram via the radial artery approach.  INDICATIONS: 53 year old nondiabetic male with obesity, hypertension, hyperlipidemia with significant anginal symptoms and nuclear stress test that was high risk demonstrating ejection fraction of 49% and anterior wall reversible defect consistent with ischemia.  The risks, benefits, and details of the procedure were explained to the patient, including possibilities of stroke, heart attack, death, renal impairment, arterial damage, bleeding. The patient verbalized understanding and wanted to proceed. Informed written consent was obtained.  PROCEDURE TECHNIQUE: Allen's test was performed pre-and post procedure and was normal. The right radial artery site was prepped and draped in a sterile fashion. One percent lidocaine was used for local anesthesia. Using the modified Seldinger technique a 5 French hydrophilic sheath was inserted into the radial artery without difficulty. 3 mg of verapamil was administered via the sheath. There was mild difficulty passing the safety J-wire just distal to the sheath insertion. An angiogram was performed. A Judkins right #4 catheter with the guidance of a Versicore wire was placed in the right coronary cusp and selectively cannulated the right coronary artery. This was passed without difficulty. After traversing the aortic arch, 5000 units of heparin IV was administered. A Judkins left #3.5 catheter was used to selectively cannulate the left main artery. Multiple views with hand injection of Omnipaque were obtained. Catheter a pigtail catheter was used to cross into the left ventricle, hemodynamics were obtained, and a left ventriculogram was performed in the RAO position with power injection. Following the procedure,  sheath was removed, patient was hemodynamically stable, hemostasis was  maintained with a Terumo T band.  CONTRAST: Total of 80 ml.  FLOUROSCOPY TIME: 3.6 min.  COMPLICATIONS: None.  HEMODYNAMICS: Aortic pressure was 623/76EGBT; LV systolic pressure was 517OHYW; LVEDP 19mHg. There was no gradient between the left ventricle and aorta.  ANGIOGRAPHIC DATA:  Left main: No angiographically significant coronary artery disease branches into LAD and circumflex artery  Left anterior descending (LAD): There is proximal LAD disease a stenosis of 95% long, tubular lesion encompassing the first septal branch just up to the first major diagonal branch.  Circumflex artery (CIRC): Small ramus branch minor luminal irregularities. Circumflex artery with one obtuse marginal branch, no significant disease.  Right coronary artery (RCA): Dominant vessel giving rise to the posterior descending artery, no angiographically significant disease.  LEFT VENTRICULOGRAM: Left ventricular angiogram was done in the 30 RAO projection and revealed mid to distal anterior wall hypokinesis with an estimated ejection fraction of 45%. No significant mitral regurgitation  IMPRESSIONS:  1. Severe proximal LAD lesion of 95% 2. Mildly reduced left ventricular systolic function with anterior wall hypokinesis. LVEDP 17 mmHg. Ejection fraction 45%. RECOMMENDATION: Percutaneous intervention to be performed by Dr. Peter JMartinique Case discussed with patient. Understands risks and benefits of procedure including stroke, heart attack, death, renal impairment. He is currently participating in Bio-Flow study.  Post procedure, we will start beta blocker, continue with statin, continue with angiotensin receptor blocker.  SCandee Furbish MD  CARDIAC CATH NOTE  Name: CSHADRACH BARTUNEK MRN: [id number] DOB: 3Nov 01, 1962 Procedure: PTCA and stenting of the proximal LAD  Indication: 53yo WM with recent chest pain and abnormal stress test. Diagnostic study showed 90-95% proximal LAD stenosis.  Procedural Details: The right wrist  access was used. Weight-based bivalirudin was given for anticoagulation. Brilinta 180 mg was given orally. Once a therapeutic ACT was achieved, a 6 [place] 3.5 guide catheter was inserted. A prowater coronary guidewire was used to cross the lesion. The lesion was predilated with a 2.5 mm balloon. The patient was enrolled in the Bioflow study and randomized to a Xience stent. The lesion was then stented with a 3.0 x 23 mm Xience stent. The stent was postdilated with a 3.25 mm noncompliant balloon. Following PCI, there was 0% residual stenosis and TIMI-3 flow. Final angiography confirmed an excellent result. The patient tolerated the procedure well. There were no immediate procedural complications. A TR band was used for radial hemostasis. The patient was transferred to the post catheterization recovery area for further monitoring.  Lesion Data:  Vessel: LAD- proximal.  Percent stenosis (pre): 90-95%  TIMI-flow (pre): 3  Stent: 3.0 x 23 mm Xience  Percent stenosis (post): 0%  TIMI-flow (post): 3  Conclusions:  1. Successful stenting of the proximal LAD with a DES.  Recommendations: Continue DAPT for one year. Anticipate DC in am.  Peter JMartinique MLaurel 05/08/2014, 12:34 PM   Treatments: See above  Discharge Exam: Blood pressure 139/85, pulse 83, temperature 97.6 F (36.4 C), temperature source Oral, resp. rate 18, height 5' 10"  (1.778 m), weight 248 lb 0.3 oz (112.5 kg), SpO2 97.00%.   Disposition: Final discharge disposition not confirmed      Discharge Instructions   Amb Referral to Cardiac Rehabilitation    Complete by:  As directed      Diet - low sodium heart healthy    Complete by:  As directed      Discharge instructions    Complete  by:  As directed   No lifting with right arm for three days.     Increase activity slowly    Complete by:  As directed             Medication List         aspirin 81 MG chewable tablet  Chew 1 tablet (81 mg total) by mouth daily.      atorvastatin 40 MG tablet  Commonly known as:  LIPITOR  Take 40 mg by mouth daily.     b complex vitamins capsule  Take 1 capsule by mouth daily.     buPROPion 300 MG 24 hr tablet  Commonly known as:  WELLBUTRIN XL  Take 300 mg by mouth daily.     GINSENG PO  Take 500 mg by mouth daily.     losartan-hydrochlorothiazide 50-12.5 MG per tablet  Commonly known as:  HYZAAR  Take 1 tablet by mouth daily.     multivitamin capsule  Take 1 capsule by mouth daily.     OMEGA 3 PO  Take 1 tablet by mouth daily.     omeprazole 20 MG capsule  Commonly known as:  PRILOSEC  Take 20 mg by mouth daily.     sertraline 100 MG tablet  Commonly known as:  ZOLOFT  Take 100 mg by mouth 2 (two) times daily.     ticagrelor 90 MG Tabs tablet  Commonly known as:  BRILINTA  Take 1 tablet (90 mg total) by mouth 2 (two) times daily.     VITAMIN C PO  Take 1 tablet by mouth daily.       Follow-up Information   Follow up with Candee Furbish, MD. (The office will call you with the follow up appt date and time)    Specialty:  Cardiology   Contact information:   [address] [place] [address] [phone]      Greater than 30 minutes was spent completing the patient's discharge.    SignedTarri Fuller, PA-C 05/09/2014, 9:49 AM

## 2014-05-09 NOTE — Progress Notes (Signed)
Subjective: No CP or SOB. Ambulated without difficulty.   Objective: Vital signs in last 24 hours: Temp:  [97.5 F (36.4 C)-99 F (37.2 C)] 97.6 F (36.4 C) (09/05 0727) Pulse Rate:  [70-83] 83 (09/05 0727) Resp:  [18] 18 (09/05 0727) BP: (102-145)/(48-94) 139/85 mmHg (09/05 0727) SpO2:  [95 %-99 %] 97 % (09/05 0727) Weight:  [245 lb (111.131 kg)-248 lb 0.3 oz (112.5 kg)] 248 lb 0.3 oz (112.5 kg) (09/05 0012) Last BM Date: 05/08/14  Intake/Output from previous day: 09/04 0701 - 09/05 0700 In: 1349.9 [P.O.:600; I.V.:749.9] Out: 900 [Urine:900] Intake/Output this shift:    Medications Current Facility-Administered Medications  Medication Dose Route Frequency Provider Last Rate Last Dose  . acetaminophen (TYLENOL) tablet 650 mg  650 mg Oral Q4H PRN Peter M Martinique, MD      . aspirin chewable tablet 81 mg  81 mg Oral Daily Peter M Martinique, MD      . atorvastatin (LIPITOR) tablet 40 mg  40 mg Oral Daily Peter M Martinique, MD      . buPROPion (WELLBUTRIN XL) 24 hr tablet 300 mg  300 mg Oral Daily Peter M Martinique, MD      . losartan (COZAAR) tablet 50 mg  50 mg Oral Daily Jettie Booze, MD       And  . hydrochlorothiazide (MICROZIDE) capsule 12.5 mg  12.5 mg Oral Daily Jettie Booze, MD      . multivitamin with minerals tablet 1 tablet  1 tablet Oral Daily Jettie Booze, MD      . omega-3 acid ethyl esters (LOVAZA) capsule 1 g  1 g Oral Daily Jettie Booze, MD      . ondansetron [facility]) injection 4 mg  4 mg Intravenous Q6H PRN Peter M Martinique, MD      . pantoprazole (PROTONIX) EC tablet 40 mg  40 mg Oral Daily Peter M Martinique, MD      . pneumococcal 23 valent vaccine (PNU-IMMUNE) injection 0.5 mL  0.5 mL Intramuscular Once Jettie Booze, MD      . sertraline (ZOLOFT) tablet 100 mg  100 mg Oral BID Peter M Martinique, MD   100 mg at 05/08/14 2212  . ticagrelor (BRILINTA) tablet 90 mg  90 mg Oral BID Peter M Martinique, MD   90 mg at 05/08/14 2213    PE: General  appearance: alert, cooperative and no distress Lungs: clear to auscultation bilaterally Heart: regular rate and rhythm, S1, S2 normal, no murmur, click, rub or gallop Extremities: No LEE Pulses: 2+ and symmetric Skin: Warm and dry Neurologic: Grossly normal.   Lab Results:   Recent Labs  05/07/14 1147 05/09/14 0308  WBC 7.5 7.4  HGB 14.9 14.8  HCT 43.8 42.7  PLT 182.0 156   BMET  Recent Labs  05/07/14 1147 05/09/14 0308  NA 137 141  K 3.9 3.6*  CL 101 102  CO2 27 26  GLUCOSE 96 99  BUN 17 16  CREATININE 1.0 0.97  CALCIUM 9.4 8.9   PT/INR  Recent Labs  05/07/14 1147  LABPROT 10.6  INR 1.0    Assessment/Plan  Active Problems:   Chest pain on exertion   Essential hypertension, benign   Pure hypercholesterolemia   Obesity, unspecified   Coronary atherosclerosis of native coronary artery   Angina pectoris, crescendo  Plan:   SP high-risk nuc leading to LHC and PTCA and stenting(DES) of the proximal LAD(90-95% stenosis).   ASA, Brilinta, lipitor,HCTZ, losartan.  BP and HR stable.  SCr stable and WNL.  Follow up with Dr. Marlou Porch or APP.  Needs lifestyle change.   LOS: 1 day    Jenica Costilow PA-C 05/09/2014 7:37 AM

## 2014-05-09 NOTE — Progress Notes (Signed)
CARDIAC REHAB PHASE I   PRE:  Rate/Rhythm: 78 SR    BP: sitting 139/85    SaO2:   MODE:  Ambulation: 580 ft   POST:  Rate/Rhythm: 91 SR    BP: sitting 131/82     SaO2:   Tolerated well, feels good. Ed completed with good reception. Seems motivated to change. Quit smoking 2 months ago and doing well with this. Interested in [facility] and will send referral to [place]. [phone]   Darrick Meigs CES, ACSM 05/09/2014 8:45 AM     78  SR

## 2014-05-09 NOTE — Progress Notes (Signed)
Stopped smoking about 2 months ago Questions answered

## 2014-05-12 LAB — POCT ACTIVATED CLOTTING TIME: Activated Clotting Time: 478 seconds

## 2014-05-12 MED FILL — Sodium Chloride IV Soln 0.9%: INTRAVENOUS | Qty: 50 | Status: AC

## 2014-05-13 ENCOUNTER — Telehealth: Payer: Self-pay | Admitting: Cardiology

## 2014-05-13 NOTE — Telephone Encounter (Signed)
Lm to cb.

## 2014-05-13 NOTE — Telephone Encounter (Signed)
New problem:    Per pt he feels a little off (indigestion) and wants to know if this is normal after stints.  Please give him a call.

## 2014-05-13 NOTE — Telephone Encounter (Signed)
Spoke with pt who states ever since his stents were placed he has had a dull sensation right where his ribs come together (at the bottom of his esophagus).  He feels as though it is a little bit better today than yesterday.  He does not think eating or walking makes it worse.  He will continue to monitor and let us know if it doesn't resolve.  No increased SOB or CP.  Will forward to MD for his knowledge.

## 2014-05-14 NOTE — Telephone Encounter (Signed)
?   GERD. He is on omeprazole 47m PO QD. Lets change to protonix 467mPO QD.  SKCandee FurbishMD

## 2014-05-14 NOTE — Telephone Encounter (Signed)
Called pt to make him aware of Dr Marlou Porch recommendations.  At this time he states the discomfort seems to be getting better and he wants to wait to see how he feels.  He will call back if he wants protonix.

## 2014-05-28 ENCOUNTER — Ambulatory Visit (INDEPENDENT_AMBULATORY_CARE_PROVIDER_SITE_OTHER): Payer: BC Managed Care – PPO | Admitting: Physician Assistant

## 2014-05-28 ENCOUNTER — Encounter: Payer: Self-pay | Admitting: Physician Assistant

## 2014-05-28 VITALS — BP 110/78 | HR 72 | Ht 70.0 in | Wt 245.0 lb

## 2014-05-28 DIAGNOSIS — I10 Essential (primary) hypertension: Secondary | ICD-10-CM

## 2014-05-28 DIAGNOSIS — E7800 Pure hypercholesterolemia, unspecified: Secondary | ICD-10-CM

## 2014-05-28 DIAGNOSIS — I2510 Atherosclerotic heart disease of native coronary artery without angina pectoris: Secondary | ICD-10-CM

## 2014-05-28 DIAGNOSIS — I2589 Other forms of chronic ischemic heart disease: Secondary | ICD-10-CM

## 2014-05-28 DIAGNOSIS — I255 Ischemic cardiomyopathy: Secondary | ICD-10-CM

## 2014-05-28 MED ORDER — METOPROLOL SUCCINATE ER 25 MG PO TB24: 12.5000 mg | ORAL_TABLET | Freq: Every day | ORAL | Status: DC

## 2014-05-28 NOTE — Patient Instructions (Signed)
Your physician has recommended you make the following change in your medication:  1. START TOPROL XL 25 MG TABLET ; YOU WILL TAKE 1/2 TABLET DAILY = 12.5 MG DAILY  You have been referred to [place] physician recommends that you schedule a follow-up appointment in: [place]

## 2014-05-28 NOTE — Progress Notes (Signed)
Cardiology Office Note    Date:  05/28/2014   ID:  Howard Lawrence, DOB 1960/10/28, MRN [id number]  PCP:  Gennette Pac, MD  Cardiologist:  Dr. Candee Furbish      History of Present Illness: Howard Lawrence is a 53 y.o. male with a hx of tobacco abuse, HTN, HL, ulcerative colitis.  He was recently evaluated by Dr. Marlou Porch for chest pain. Myoview demonstrated anteroseptal ischemia. He was referred for cardiac catheterization which demonstrated high-grade proximal LAD lesion. This was treated with a drug-eluting stent. He tolerated the procedure well and was discharged home the next day. Dual antiplatelet therapy was recommended for 1 year. Since discharge, he is doing well. The patient denies any chest pain, significant dyspnea, syncope, orthopnea, PND, edema.  Studies:  - LHC (9/15):  LAD 95%, EF 45% ant HK >> PCI:  3.0 x 23 mm Xience DES to prox LAD  - Nuclear (9/15):  Ant-septal ischemia, EF 49%; High Risk   Recent Labs/Images:  05/09/2014: BUN 16; Creatinine 0.97; Hemoglobin 14.8; Potassium 3.6*; Sodium 141; WBC 7.4     Wt Readings from Last 3 Encounters:  05/28/14 245 lb (111.131 kg)  05/09/14 248 lb 0.3 oz (112.5 kg)  05/09/14 248 lb 0.3 oz (112.5 kg)     Past Medical History  Diagnosis Date  . Hypertension   . Ulcerative colitis   . Coronary artery disease   . GERD (gastroesophageal reflux disease)     Current Outpatient Prescriptions  Medication Sig Dispense Refill  . Ascorbic Acid (VITAMIN C PO) Take 1 tablet by mouth daily.      Marland Kitchen aspirin 81 MG tablet Take 81 mg by mouth daily.      Marland Kitchen atorvastatin (LIPITOR) 40 MG tablet Take 40 mg by mouth daily.      Marland Kitchen b complex vitamins capsule Take 1 capsule by mouth daily.      Marland Kitchen buPROPion (WELLBUTRIN XL) 300 MG 24 hr tablet Take 300 mg by mouth daily.      Marland Kitchen GINSENG PO Take 500 mg by mouth daily.      Marland Kitchen losartan-hydrochlorothiazide (HYZAAR) 50-12.5 MG per tablet Take 1 tablet by mouth daily.      . Multiple Vitamin  (MULTIVITAMIN) capsule Take 1 capsule by mouth daily.      . Omega-3 Fatty Acids (OMEGA 3 PO) Take 1 tablet by mouth daily.       Marland Kitchen omeprazole (PRILOSEC) 20 MG capsule Take 20 mg by mouth daily.      . sertraline (ZOLOFT) 100 MG tablet Take 100 mg by mouth 2 (two) times daily.       . ticagrelor (BRILINTA) 90 MG TABS tablet Take 1 tablet (90 mg total) by mouth 2 (two) times daily.  60 tablet  10  . metoprolol succinate (TOPROL-XL) 25 MG 24 hr tablet Take 0.5 tablets (12.5 mg total) by mouth at bedtime.  30 tablet  11   No current facility-administered medications for this visit.     Allergies:   Review of patient's allergies indicates no known allergies.   Social History:  The patient  reports that he quit smoking about 2 months ago. He started smoking about 3 months ago. He has never used smokeless tobacco. He reports that he does not drink alcohol or use illicit drugs.   Family History:  The patient's family history includes Arthritis/Rheumatoid in his mother; Liver disease in his father; Lung cancer in his paternal grandmother. There is no history of Heart  attack or Stroke.   ROS:  Please see the history of present illness.       All other systems reviewed and negative.    PHYSICAL EXAM: VS:  BP 110/78  Pulse 72  Ht 5' 10"  (1.778 m)  Wt 245 lb (111.131 kg)  BMI 35.15 kg/m2 Well nourished, well developed, in no acute distress HEENT: normal Neck:  no JVD Cardiac:  normal S1, S2;  RRR; no murmur Lungs:   clear to auscultation bilaterally, no wheezing, rhonchi or rales Abd: soft, nontender, no hepatomegaly Ext: right wrist without hematoma or mass;  no edema Skin: warm and dry Neuro:  CNs 2-12 intact, no focal abnormalities noted  EKG:  NSR, HR 72, normal axis      ASSESSMENT AND PLAN:  1. Coronary Artery Disease: Doing well after recent PCI to the LAD. He denies angina. He is interested in cardiac rehabilitation. He will be referred.  We discussed the importance of dual  antiplatelet therapy.  Continue aspirin, Brilinta. Continue statin. 2. Ischemic Cardiomyopathy: Mildly reduced LV function with wall motion abnormality noted at nuclear study and cardiac catheterization. Continue ARB. Start  low-dose beta blocker with Toprol-XL 12.5 mg daily.  Consider follow up echocardiogram 90 days post PCI.  He currently does not have any signs or symptoms of congestive heart failure. 3.  Hypertension:  Controlled. 4.  Hyperlipidemia: Continue statin. This is managed by primary care. Goal LDL is less than 70. 5. Tobacco Abuse: The patient has stopped smoking cigarettes.  Disposition:    FU with Dr. Marlou Porch 8 weeks.   Signed, Versie Starks, MHS 05/28/2014 12:13 PM    [facility] [place], [place], [place]  [address] Phone: [phone]; Fax: [phone]

## 2014-06-18 ENCOUNTER — Ambulatory Visit (HOSPITAL_COMMUNITY): Payer: BC Managed Care – PPO

## 2014-06-22 ENCOUNTER — Encounter (HOSPITAL_COMMUNITY): Payer: BC Managed Care – PPO

## 2014-06-24 ENCOUNTER — Encounter (HOSPITAL_COMMUNITY): Payer: BC Managed Care – PPO

## 2014-06-25 ENCOUNTER — Encounter (HOSPITAL_COMMUNITY)
Admission: RE | Admit: 2014-06-25 | Discharge: 2014-06-25 | Disposition: A | Discharge status: Home / Self Care | Payer: BC Managed Care – PPO | Source: Ambulatory Visit | Attending: Cardiology | Admitting: Cardiology

## 2014-06-25 ENCOUNTER — Ambulatory Visit (HOSPITAL_COMMUNITY): Payer: BC Managed Care – PPO

## 2014-06-25 DIAGNOSIS — I2510 Atherosclerotic heart disease of native coronary artery without angina pectoris: Secondary | ICD-10-CM | POA: Insufficient documentation

## 2014-06-25 DIAGNOSIS — Z955 Presence of coronary angioplasty implant and graft: Secondary | ICD-10-CM | POA: Insufficient documentation

## 2014-06-25 DIAGNOSIS — E669 Obesity, unspecified: Secondary | ICD-10-CM | POA: Insufficient documentation

## 2014-06-25 DIAGNOSIS — Z6835 Body mass index (BMI) 35.0-35.9, adult: Secondary | ICD-10-CM | POA: Insufficient documentation

## 2014-06-25 DIAGNOSIS — F17200 Nicotine dependence, unspecified, uncomplicated: Secondary | ICD-10-CM | POA: Insufficient documentation

## 2014-06-25 DIAGNOSIS — I10 Essential (primary) hypertension: Secondary | ICD-10-CM | POA: Insufficient documentation

## 2014-06-25 DIAGNOSIS — E785 Hyperlipidemia, unspecified: Secondary | ICD-10-CM | POA: Insufficient documentation

## 2014-06-25 DIAGNOSIS — E780 Pure hypercholesterolemia: Secondary | ICD-10-CM | POA: Insufficient documentation

## 2014-06-25 DIAGNOSIS — Z5189 Encounter for other specified aftercare: Secondary | ICD-10-CM | POA: Insufficient documentation

## 2014-06-25 DIAGNOSIS — I200 Unstable angina: Secondary | ICD-10-CM | POA: Insufficient documentation

## 2014-06-25 NOTE — Progress Notes (Signed)
Cardiac Rehab Medication Review by a Pharmacist  Does the patient  feel that his/her medications are working for him/her?  yes  Has the patient been experiencing any side effects to the medications prescribed?  no  Does the patient measure his/her own blood pressure or blood glucose at home?  no   Does the patient have any problems obtaining medications due to transportation or finances?   no  Understanding of regimen: good Understanding of indications: good Potential of compliance: good    Pharmacist comments: 53 yo Howard Lawrence presents to cardiac rehab orientation in good spirits. Medication list was reviewed as well as preferred pharmacy and allergies, patient articulates and verbalizes regimen. Patient does not currently take his blood pressure at home but states if he had a device he would. He explains that he probably misses his nightly dose of his medication once a week due to forgetting to take it. Reminder strategies were discussed and patient verbalized interest in setting an alarm on his phone to remind him. All questions were answered and patient expressed understanding.  Thank you for allowing pharmacy to be part of this patient's care team  Howard Lawrence M. Anabel Lykins, Pharm.D Clinical Pharmacy Resident Pager: [phone] 06/25/2014 .9:09 AM        Howard Lawrence 06/25/2014 9:04 AM

## 2014-06-26 ENCOUNTER — Encounter (HOSPITAL_COMMUNITY): Payer: BC Managed Care – PPO

## 2014-06-29 ENCOUNTER — Encounter (HOSPITAL_COMMUNITY): Payer: Self-pay

## 2014-06-29 ENCOUNTER — Encounter (HOSPITAL_COMMUNITY)
Admission: RE | Admit: 2014-06-29 | Discharge: 2014-06-29 | Disposition: A | Discharge status: Home / Self Care | Payer: BC Managed Care – PPO | Source: Ambulatory Visit | Attending: Cardiology | Admitting: Cardiology

## 2014-06-29 DIAGNOSIS — Z955 Presence of coronary angioplasty implant and graft: Secondary | ICD-10-CM | POA: Diagnosis not present

## 2014-06-29 DIAGNOSIS — I10 Essential (primary) hypertension: Secondary | ICD-10-CM | POA: Diagnosis not present

## 2014-06-29 DIAGNOSIS — E780 Pure hypercholesterolemia: Secondary | ICD-10-CM | POA: Diagnosis not present

## 2014-06-29 DIAGNOSIS — Z6835 Body mass index (BMI) 35.0-35.9, adult: Secondary | ICD-10-CM | POA: Diagnosis not present

## 2014-06-29 DIAGNOSIS — E785 Hyperlipidemia, unspecified: Secondary | ICD-10-CM | POA: Diagnosis not present

## 2014-06-29 DIAGNOSIS — I200 Unstable angina: Secondary | ICD-10-CM | POA: Diagnosis not present

## 2014-06-29 DIAGNOSIS — I2510 Atherosclerotic heart disease of native coronary artery without angina pectoris: Secondary | ICD-10-CM | POA: Diagnosis not present

## 2014-06-29 DIAGNOSIS — F17200 Nicotine dependence, unspecified, uncomplicated: Secondary | ICD-10-CM | POA: Diagnosis not present

## 2014-06-29 DIAGNOSIS — Z5189 Encounter for other specified aftercare: Secondary | ICD-10-CM | POA: Diagnosis not present

## 2014-06-29 DIAGNOSIS — E669 Obesity, unspecified: Secondary | ICD-10-CM | POA: Diagnosis not present

## 2014-06-29 NOTE — Progress Notes (Signed)
Pt started cardiac rehab today.  Pt tolerated light exercise without difficulty.  VSS, telemetry-sinus rhythm, asymptomatic.  PHQ-0.  Pt exhibits positive outlook, good coping skills and supportive family.  Pt reports recently stopped smoking in July 2015.  Pt denies difficulty with this transition.  Pt congratulated on his success.  Pt cardiac rehab goals are to lose weight and learn about good nutrition.  Pt oriented to exercise equipment and routine.  Understanding verbalized.

## 2014-07-01 ENCOUNTER — Encounter (HOSPITAL_COMMUNITY)
Admission: RE | Admit: 2014-07-01 | Discharge: 2014-07-01 | Disposition: A | Discharge status: Home / Self Care | Payer: BC Managed Care – PPO | Source: Ambulatory Visit | Attending: Cardiology | Admitting: Cardiology

## 2014-07-01 DIAGNOSIS — Z5189 Encounter for other specified aftercare: Secondary | ICD-10-CM | POA: Diagnosis not present

## 2014-07-03 ENCOUNTER — Encounter (HOSPITAL_COMMUNITY)
Admission: RE | Admit: 2014-07-03 | Discharge: 2014-07-03 | Disposition: A | Discharge status: Home / Self Care | Payer: BC Managed Care – PPO | Source: Ambulatory Visit | Attending: Cardiology | Admitting: Cardiology

## 2014-07-03 DIAGNOSIS — Z5189 Encounter for other specified aftercare: Secondary | ICD-10-CM | POA: Diagnosis not present

## 2014-07-06 ENCOUNTER — Encounter (HOSPITAL_COMMUNITY)
Admission: RE | Admit: 2014-07-06 | Discharge: 2014-07-06 | Disposition: A | Discharge status: Home / Self Care | Payer: BC Managed Care – PPO | Source: Ambulatory Visit | Attending: Cardiology | Admitting: Cardiology

## 2014-07-06 DIAGNOSIS — E669 Obesity, unspecified: Secondary | ICD-10-CM | POA: Insufficient documentation

## 2014-07-06 DIAGNOSIS — F17200 Nicotine dependence, unspecified, uncomplicated: Secondary | ICD-10-CM | POA: Diagnosis not present

## 2014-07-06 DIAGNOSIS — Z5189 Encounter for other specified aftercare: Secondary | ICD-10-CM | POA: Diagnosis present

## 2014-07-06 DIAGNOSIS — I2510 Atherosclerotic heart disease of native coronary artery without angina pectoris: Secondary | ICD-10-CM | POA: Diagnosis not present

## 2014-07-06 DIAGNOSIS — I200 Unstable angina: Secondary | ICD-10-CM | POA: Diagnosis not present

## 2014-07-06 DIAGNOSIS — Z6835 Body mass index (BMI) 35.0-35.9, adult: Secondary | ICD-10-CM | POA: Insufficient documentation

## 2014-07-06 DIAGNOSIS — I10 Essential (primary) hypertension: Secondary | ICD-10-CM | POA: Diagnosis not present

## 2014-07-06 DIAGNOSIS — Z955 Presence of coronary angioplasty implant and graft: Secondary | ICD-10-CM | POA: Insufficient documentation

## 2014-07-06 DIAGNOSIS — E780 Pure hypercholesterolemia: Secondary | ICD-10-CM | POA: Diagnosis not present

## 2014-07-06 DIAGNOSIS — E785 Hyperlipidemia, unspecified: Secondary | ICD-10-CM | POA: Insufficient documentation

## 2014-07-08 ENCOUNTER — Encounter (HOSPITAL_COMMUNITY)
Admission: RE | Admit: 2014-07-08 | Discharge: 2014-07-08 | Disposition: A | Discharge status: Home / Self Care | Payer: BC Managed Care – PPO | Source: Ambulatory Visit | Attending: Cardiology | Admitting: Cardiology

## 2014-07-08 DIAGNOSIS — Z5189 Encounter for other specified aftercare: Secondary | ICD-10-CM | POA: Diagnosis not present

## 2014-07-08 NOTE — Progress Notes (Signed)
I have reviewed home exercise with Lissa Hoard. The patient was advised to walk 2-4 days per week outside of CRP II for 30 minutes continuously.  Pt will also complete one additional day of hand weights outside of CRP II.  Progression of exercise prescription was discussed.  Reviewed THR, pulse, RPE, sign and symptoms, NTG use and when to call 911 or MD.  Pt voiced understanding. 0815  Archie Endo, MS, ACSM RCEP 07/08/2014 11:19 AM

## 2014-07-10 ENCOUNTER — Encounter (HOSPITAL_COMMUNITY)
Admission: RE | Admit: 2014-07-10 | Discharge: 2014-07-10 | Disposition: A | Discharge status: Home / Self Care | Payer: BC Managed Care – PPO | Source: Ambulatory Visit | Attending: Cardiology | Admitting: Cardiology

## 2014-07-10 DIAGNOSIS — Z5189 Encounter for other specified aftercare: Secondary | ICD-10-CM | POA: Diagnosis not present

## 2014-07-13 ENCOUNTER — Encounter (HOSPITAL_COMMUNITY)
Admission: RE | Admit: 2014-07-13 | Discharge: 2014-07-13 | Disposition: A | Discharge status: Home / Self Care | Payer: BC Managed Care – PPO | Source: Ambulatory Visit | Attending: Cardiology | Admitting: Cardiology

## 2014-07-13 DIAGNOSIS — Z5189 Encounter for other specified aftercare: Secondary | ICD-10-CM | POA: Diagnosis not present

## 2014-07-15 ENCOUNTER — Telehealth (HOSPITAL_COMMUNITY): Payer: Self-pay | Admitting: Family Medicine

## 2014-07-15 ENCOUNTER — Encounter (HOSPITAL_COMMUNITY): Payer: BC Managed Care – PPO

## 2014-07-17 ENCOUNTER — Encounter (HOSPITAL_COMMUNITY)
Admission: RE | Admit: 2014-07-17 | Discharge: 2014-07-17 | Disposition: A | Discharge status: Home / Self Care | Payer: BC Managed Care – PPO | Source: Ambulatory Visit | Attending: Cardiology | Admitting: Cardiology

## 2014-07-17 DIAGNOSIS — Z5189 Encounter for other specified aftercare: Secondary | ICD-10-CM | POA: Diagnosis not present

## 2014-07-20 ENCOUNTER — Encounter (HOSPITAL_COMMUNITY)
Admission: RE | Admit: 2014-07-20 | Discharge: 2014-07-20 | Disposition: A | Discharge status: Home / Self Care | Payer: BC Managed Care – PPO | Source: Ambulatory Visit | Attending: Cardiology | Admitting: Cardiology

## 2014-07-20 DIAGNOSIS — Z5189 Encounter for other specified aftercare: Secondary | ICD-10-CM | POA: Diagnosis not present

## 2014-07-20 NOTE — Progress Notes (Signed)
PSYCHOSOCIAL ASSESSMENT  Pt psychosocial assessment reveals no barriers to rehab participation.  Pt quality of life is slightly altered by his physical constraints which limits his ability to perform tasks as prior to his illness. Pt has significant worries and decreased time spent in pleasurable activities.  Pt   Pt exhibits positive coping skills and has supportive family.  Offered emotional support and reassurance.  Will continue to monitor.

## 2014-07-21 ENCOUNTER — Ambulatory Visit (INDEPENDENT_AMBULATORY_CARE_PROVIDER_SITE_OTHER): Payer: BC Managed Care – PPO | Admitting: Cardiology

## 2014-07-21 ENCOUNTER — Encounter: Payer: Self-pay | Admitting: Cardiology

## 2014-07-21 VITALS — BP 118/78 | HR 86 | Ht 70.0 in | Wt 252.0 lb

## 2014-07-21 DIAGNOSIS — E669 Obesity, unspecified: Secondary | ICD-10-CM

## 2014-07-21 DIAGNOSIS — I2583 Coronary atherosclerosis due to lipid rich plaque: Principal | ICD-10-CM

## 2014-07-21 DIAGNOSIS — E7800 Pure hypercholesterolemia, unspecified: Secondary | ICD-10-CM

## 2014-07-21 DIAGNOSIS — I10 Essential (primary) hypertension: Secondary | ICD-10-CM

## 2014-07-21 DIAGNOSIS — I2510 Atherosclerotic heart disease of native coronary artery without angina pectoris: Secondary | ICD-10-CM

## 2014-07-21 DIAGNOSIS — Z79899 Other long term (current) drug therapy: Secondary | ICD-10-CM | POA: Insufficient documentation

## 2014-07-21 NOTE — Patient Instructions (Signed)
The current medical regimen is effective;  continue present plan and medications.  Please have blood work today (bmp,lipid and Alt)  Follow up in 4 months with Dr Marlou Porch.

## 2014-07-21 NOTE — Progress Notes (Signed)
[facility]. [address], [address] [place], [place]  [address] Phone: [phone] Fax:  [phone]  Date:  07/21/2014   ID:  Howard Lawrence, DOB 03-Jan-1961, MRN [id number]  PCP:  Howard Pac, MD   History of Present Illness: Howard Lawrence is a 53 y.o. male with coronary artery disease, high-grade proximal LAD lesion after nuclear stress test demonstrate an anteroseptal ischemia, drug eluding stent 3.0 x 23 mm Xience DES to prox LAD, EF 45% on 05/08/14.   Symptoms previously occurred at [facility]. Cardiac rehabilitation. Low-dose beta blocker was started because of mildly reduced ejection fraction. No signs of heart failure. Feeling well, no chest pain, no shortness of breath.  He has a multitude of cardiac risk factors including smoking, hypertension, hyperlipidemia. He has ulcerative colitis. In review of [facility] notes, he was dismissed from the GI practice, Dr. Cristina Lawrence, for noncompliance.  Wt Readings from Last 3 Encounters:  07/21/14 252 lb (114.306 kg)  06/25/14 249 lb 9 oz (113.2 kg)  05/28/14 245 lb (111.131 kg)     Past medical history: Depression and anxiety Ulcerative colitis in adenomatous colon polyps Tobacco use Hypertension Basal cell carcinoma of the face removed by Dr. Sarajane Lawrence in 2015 Alcohol use.  Past Surgical History  Procedure Laterality Date  . Skin cancer removal from right side of face    . Coronary stent placement  05/08/2014    DES to LAD         Dr Irish Lack    Current Outpatient Prescriptions  Medication Sig Dispense Refill  . Ascorbic Acid (VITAMIN C PO) Take 1 tablet by mouth daily.    Marland Kitchen aspirin 81 MG tablet Take 81 mg by mouth daily.    Marland Kitchen atorvastatin (LIPITOR) 80 MG tablet Take 80 mg by mouth daily.    Marland Kitchen b complex vitamins capsule Take 1 capsule by mouth daily.    Marland Kitchen buPROPion (WELLBUTRIN XL) 300 MG 24 hr tablet Take 300 mg by mouth daily.    Marland Kitchen losartan-hydrochlorothiazide (HYZAAR) 50-12.5 MG per tablet Take 1 tablet by mouth daily.    .  metoprolol succinate (TOPROL-XL) 25 MG 24 hr tablet Take 0.5 tablets (12.5 mg total) by mouth at bedtime. 30 tablet 11  . Multiple Vitamin (MULTIVITAMIN) capsule Take 1 capsule by mouth daily.    . Omega-3 Fatty Acids (OMEGA 3 PO) Take 1,000 mg by mouth daily.     Marland Kitchen omeprazole (PRILOSEC) 20 MG capsule Take 20 mg by mouth daily.    . sertraline (ZOLOFT) 100 MG tablet Take 200 mg by mouth daily.     . ticagrelor (BRILINTA) 90 MG TABS tablet Take 1 tablet (90 mg total) by mouth 2 (two) times daily. 60 tablet 10   No current facility-administered medications for this visit.    Allergies:   No Known Allergies  Social History:  The patient  reports that he quit smoking about 4 months ago. He started smoking about 5 months ago. He has never used smokeless tobacco. He reports that he does not drink alcohol or use illicit drugs.  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 Family History  Problem Relation Age of Onset  . Arthritis/Rheumatoid Mother   . Liver disease Father   . Lung cancer Paternal Grandmother   . Heart attack Neg Hx   . Stroke Neg Hx    His father died of pancreatic cancer status post Whipple procedure, no early family  history of coronary artery disease.  ROS:  Please see the history of present illness.   Denies any bleeding, syncope, fevers, chills, orthopnea, PND.   All other systems reviewed and negative.   PHYSICAL EXAM: VS:  BP 118/78 mmHg  Pulse 86  Ht 5' 10"  (1.778 m)  Wt 252 lb (114.306 kg)  BMI 36.16 kg/m2 Well nourished, well developed, in no acute distress HEENT: normal, [place]/AT, EOMI Neck: no JVD, normal carotid upstroke, no bruit Cardiac:  normal S1, S2; RRR; no murmur Lungs:  clear to auscultation bilaterally, no wheezing, rhonchi or rales Abd: soft, nontender, no hepatomegaly, no bruitsoverweight Ext: no edema, 2+ distal pulses Skin: warm and dry GU: deferred Neuro: no focal abnormalities noted, AAO x 3  EKG:   03/18/14-sinus rhythm, no other abnormalities. 04/27/14-sinus rhythm, no other allergies-no change from prior  Labs: Hemoglobin A1c 5.4, hemoglobin 15.2, creatinine 1.4, TSH 2.2, LDL 134, triglycerides 422, previous triglycerides 126 with LDL of 127     ASSESSMENT AND PLAN:  1. Coronary artery disease-proximal LAD stent-dual antiplatelet therapy and importance discussed. Continue for 1 year. 2. Hypertension-overall reasonable control. Doing well. BMET. Tolerating low-dose Toprol. 3. Hyperlipidemia-atorvastatin, check lipids. ALT.  4. Obesity-continue to encourage weight loss.decreasing carbohydrates 5. Ulcerative colitis-establishing with new gastroenterologist. 6. 4 month follow up.   Signed, Howard Furbish, MD [facility]  07/21/2014 10:17 AM

## 2014-07-22 ENCOUNTER — Encounter (HOSPITAL_COMMUNITY)
Admission: RE | Admit: 2014-07-22 | Discharge: 2014-07-22 | Disposition: A | Discharge status: Home / Self Care | Payer: BC Managed Care – PPO | Source: Ambulatory Visit | Attending: Cardiology | Admitting: Cardiology

## 2014-07-22 DIAGNOSIS — Z5189 Encounter for other specified aftercare: Secondary | ICD-10-CM | POA: Diagnosis not present

## 2014-07-22 LAB — LIPID PANEL
CHOLESTEROL: 170 mg/dL (ref 0–200)
HDL: 40 mg/dL (ref >39.00)
NonHDL: 130
Total CHOL/HDL Ratio: 4
Triglycerides: 339 mg/dL — ABNORMAL HIGH (ref 0.0–149.0)
VLDL: 67.8 mg/dL — AB (ref 0.0–40.0)

## 2014-07-22 LAB — BASIC METABOLIC PANEL
BUN: 20 mg/dL (ref 6–23)
CALCIUM: 9.4 mg/dL (ref 8.4–10.5)
CHLORIDE: 102 meq/L (ref 96–112)
CO2: 27 meq/L (ref 19–32)
Creatinine, Ser: 1 mg/dL (ref 0.4–1.5)
GFR: 78.89 mL/min (ref >60.00)
GLUCOSE: 106 mg/dL — AB (ref 70–99)
Potassium: 3.8 mEq/L (ref 3.5–5.1)
SODIUM: 137 meq/L (ref 135–145)

## 2014-07-22 LAB — ALT: ALT: 39 U/L (ref 0–53)

## 2014-07-22 LAB — LDL CHOLESTEROL, DIRECT: Direct LDL: 93 mg/dL

## 2014-07-22 NOTE — Progress Notes (Signed)
Howard Lawrence 53 y.o. male Nutrition Note Spoke with pt.  Nutrition Survey reviewed with pt. Pt is not following the Therapeutic Lifestyle Changes diet. Pt continues to eat out frequently (3-4 times/week). Pt food choices when eating out not optimal for heart health [e.g. Karen Kitchens, HOPS (burgers, fries)]. Pt wants to lose wt. Pt is not actively trying to lose wt. Wt loss tips reviewed. Pt expressed understanding of the information reviewed. Pt aware of nutrition education classes offered and plans on attending nutrition classes.  Nutrition Diagnosis ? Food-and nutrition-related knowledge deficit related to lack of exposure to information as related to diagnosis of: ? CVD  ? Obesity related to excessive energy intake as evidenced by a BMI of 35.8  Nutrition Intervention ? Benefits of adopting Therapeutic Lifestyle Changes discussed when Medficts reviewed. ? Pt to attend the Portion Distortion class ? Pt to attend the  ? Nutrition I class                     ? Nutrition II class ? Pt given handouts for: ? Nutrition I class ? Nutrition II class ? Continue client-centered nutrition education by RD, as part of interdisciplinary care.  Goal(s) ? Pt to eat more non-starchy vegetables. ? Pt to eat more fruit. ? Pt to eat whole grains for breakfast. ? Pt to cut down on beef or pork high in saturated fat. ? Pt to use lower fat milk. ? Pt to go easy on high fat cheeses. ? Pt to choose trans fat-free margarine. ? Pt to decrease dietary sodium intake.  ? Pt to watch out for sweets and added sugar. ? Pt to make good choices when eating out at restaurants. ? Pt to identify food quantities necessary to achieve: ? wt loss to a goal wt of 225-243 lb (102.3-110.5 kg) at graduation from cardiac rehab.   Monitor and Evaluate progress toward nutrition goal with team.   Derek Mound, M.Ed, RD, LDN, CDE 07/22/2014 9:31 AM

## 2014-07-24 ENCOUNTER — Encounter (HOSPITAL_COMMUNITY)
Admission: RE | Admit: 2014-07-24 | Discharge: 2014-07-24 | Disposition: A | Discharge status: Home / Self Care | Payer: BC Managed Care – PPO | Source: Ambulatory Visit | Attending: Cardiology | Admitting: Cardiology

## 2014-07-24 DIAGNOSIS — Z5189 Encounter for other specified aftercare: Secondary | ICD-10-CM | POA: Diagnosis not present

## 2014-07-27 ENCOUNTER — Encounter (HOSPITAL_COMMUNITY)
Admission: RE | Admit: 2014-07-27 | Discharge: 2014-07-27 | Disposition: A | Discharge status: Home / Self Care | Payer: BC Managed Care – PPO | Source: Ambulatory Visit | Attending: Cardiology | Admitting: Cardiology

## 2014-07-27 DIAGNOSIS — Z5189 Encounter for other specified aftercare: Secondary | ICD-10-CM | POA: Diagnosis not present

## 2014-07-29 ENCOUNTER — Encounter (HOSPITAL_COMMUNITY)
Admission: RE | Admit: 2014-07-29 | Discharge: 2014-07-29 | Disposition: A | Discharge status: Home / Self Care | Payer: BC Managed Care – PPO | Source: Ambulatory Visit | Attending: Cardiology | Admitting: Cardiology

## 2014-07-29 DIAGNOSIS — Z5189 Encounter for other specified aftercare: Secondary | ICD-10-CM | POA: Diagnosis not present

## 2014-07-31 ENCOUNTER — Encounter (HOSPITAL_COMMUNITY): Payer: BC Managed Care – PPO

## 2014-08-03 ENCOUNTER — Encounter (HOSPITAL_COMMUNITY)
Admission: RE | Admit: 2014-08-03 | Discharge: 2014-08-03 | Disposition: A | Discharge status: Home / Self Care | Payer: BC Managed Care – PPO | Source: Ambulatory Visit | Attending: Cardiology | Admitting: Cardiology

## 2014-08-03 DIAGNOSIS — Z5189 Encounter for other specified aftercare: Secondary | ICD-10-CM | POA: Diagnosis not present

## 2014-08-05 ENCOUNTER — Encounter (HOSPITAL_COMMUNITY)
Admission: RE | Admit: 2014-08-05 | Discharge: 2014-08-05 | Disposition: A | Discharge status: Home / Self Care | Payer: BC Managed Care – PPO | Source: Ambulatory Visit | Attending: Cardiology | Admitting: Cardiology

## 2014-08-05 DIAGNOSIS — E780 Pure hypercholesterolemia: Secondary | ICD-10-CM | POA: Insufficient documentation

## 2014-08-05 DIAGNOSIS — Z5189 Encounter for other specified aftercare: Secondary | ICD-10-CM | POA: Diagnosis not present

## 2014-08-05 DIAGNOSIS — I2510 Atherosclerotic heart disease of native coronary artery without angina pectoris: Secondary | ICD-10-CM | POA: Diagnosis not present

## 2014-08-05 DIAGNOSIS — Z6835 Body mass index (BMI) 35.0-35.9, adult: Secondary | ICD-10-CM | POA: Insufficient documentation

## 2014-08-05 DIAGNOSIS — E669 Obesity, unspecified: Secondary | ICD-10-CM | POA: Diagnosis not present

## 2014-08-05 DIAGNOSIS — I10 Essential (primary) hypertension: Secondary | ICD-10-CM | POA: Diagnosis not present

## 2014-08-05 DIAGNOSIS — Z955 Presence of coronary angioplasty implant and graft: Secondary | ICD-10-CM | POA: Insufficient documentation

## 2014-08-05 DIAGNOSIS — I200 Unstable angina: Secondary | ICD-10-CM | POA: Insufficient documentation

## 2014-08-05 DIAGNOSIS — E785 Hyperlipidemia, unspecified: Secondary | ICD-10-CM | POA: Insufficient documentation

## 2014-08-05 DIAGNOSIS — F17200 Nicotine dependence, unspecified, uncomplicated: Secondary | ICD-10-CM | POA: Insufficient documentation

## 2014-08-07 ENCOUNTER — Encounter (HOSPITAL_COMMUNITY)
Admission: RE | Admit: 2014-08-07 | Discharge: 2014-08-07 | Disposition: A | Discharge status: Home / Self Care | Payer: BC Managed Care – PPO | Source: Ambulatory Visit | Attending: Cardiology | Admitting: Cardiology

## 2014-08-07 DIAGNOSIS — Z5189 Encounter for other specified aftercare: Secondary | ICD-10-CM | POA: Diagnosis not present

## 2014-08-10 ENCOUNTER — Encounter (HOSPITAL_COMMUNITY)
Admission: RE | Admit: 2014-08-10 | Discharge: 2014-08-10 | Disposition: A | Discharge status: Home / Self Care | Payer: BC Managed Care – PPO | Source: Ambulatory Visit | Attending: Cardiology | Admitting: Cardiology

## 2014-08-10 DIAGNOSIS — Z5189 Encounter for other specified aftercare: Secondary | ICD-10-CM | POA: Diagnosis not present

## 2014-08-12 ENCOUNTER — Encounter (HOSPITAL_COMMUNITY)
Admission: RE | Admit: 2014-08-12 | Discharge: 2014-08-12 | Disposition: A | Discharge status: Home / Self Care | Payer: BC Managed Care – PPO | Source: Ambulatory Visit | Attending: Cardiology | Admitting: Cardiology

## 2014-08-12 DIAGNOSIS — Z5189 Encounter for other specified aftercare: Secondary | ICD-10-CM | POA: Diagnosis not present

## 2014-08-13 ENCOUNTER — Encounter (HOSPITAL_COMMUNITY): Payer: Self-pay | Admitting: Cardiology

## 2014-08-14 ENCOUNTER — Encounter (HOSPITAL_COMMUNITY)
Admission: RE | Admit: 2014-08-14 | Discharge: 2014-08-14 | Disposition: A | Discharge status: Home / Self Care | Payer: BC Managed Care – PPO | Source: Ambulatory Visit | Attending: Cardiology | Admitting: Cardiology

## 2014-08-14 DIAGNOSIS — Z5189 Encounter for other specified aftercare: Secondary | ICD-10-CM | POA: Diagnosis not present

## 2014-08-17 ENCOUNTER — Encounter (HOSPITAL_COMMUNITY)
Admission: RE | Admit: 2014-08-17 | Discharge: 2014-08-17 | Disposition: A | Discharge status: Home / Self Care | Payer: BC Managed Care – PPO | Source: Ambulatory Visit | Attending: Cardiology | Admitting: Cardiology

## 2014-08-17 DIAGNOSIS — Z5189 Encounter for other specified aftercare: Secondary | ICD-10-CM | POA: Diagnosis not present

## 2014-08-19 ENCOUNTER — Encounter (HOSPITAL_COMMUNITY)
Admission: RE | Admit: 2014-08-19 | Discharge: 2014-08-19 | Disposition: A | Discharge status: Home / Self Care | Payer: BC Managed Care – PPO | Source: Ambulatory Visit | Attending: Cardiology | Admitting: Cardiology

## 2014-08-19 DIAGNOSIS — Z5189 Encounter for other specified aftercare: Secondary | ICD-10-CM | POA: Diagnosis not present

## 2014-08-21 ENCOUNTER — Encounter (HOSPITAL_COMMUNITY)
Admission: RE | Admit: 2014-08-21 | Discharge: 2014-08-21 | Disposition: A | Discharge status: Home / Self Care | Payer: BC Managed Care – PPO | Source: Ambulatory Visit | Attending: Cardiology | Admitting: Cardiology

## 2014-08-21 DIAGNOSIS — Z5189 Encounter for other specified aftercare: Secondary | ICD-10-CM | POA: Diagnosis not present

## 2014-08-24 ENCOUNTER — Encounter (HOSPITAL_COMMUNITY)
Admission: RE | Admit: 2014-08-24 | Discharge: 2014-08-24 | Disposition: A | Discharge status: Home / Self Care | Payer: BC Managed Care – PPO | Source: Ambulatory Visit | Attending: Cardiology | Admitting: Cardiology

## 2014-08-24 DIAGNOSIS — Z5189 Encounter for other specified aftercare: Secondary | ICD-10-CM | POA: Diagnosis not present

## 2014-08-26 ENCOUNTER — Encounter (HOSPITAL_COMMUNITY)
Admission: RE | Admit: 2014-08-26 | Discharge: 2014-08-26 | Disposition: A | Discharge status: Home / Self Care | Payer: BC Managed Care – PPO | Source: Ambulatory Visit | Attending: Cardiology | Admitting: Cardiology

## 2014-08-26 DIAGNOSIS — Z5189 Encounter for other specified aftercare: Secondary | ICD-10-CM | POA: Diagnosis not present

## 2014-08-28 ENCOUNTER — Encounter (HOSPITAL_COMMUNITY): Payer: BC Managed Care – PPO

## 2014-08-31 ENCOUNTER — Encounter (HOSPITAL_COMMUNITY)
Admission: RE | Admit: 2014-08-31 | Discharge: 2014-08-31 | Disposition: A | Discharge status: Home / Self Care | Payer: BC Managed Care – PPO | Source: Ambulatory Visit | Attending: Cardiology | Admitting: Cardiology

## 2014-08-31 DIAGNOSIS — Z5189 Encounter for other specified aftercare: Secondary | ICD-10-CM | POA: Diagnosis not present

## 2014-09-02 ENCOUNTER — Encounter (HOSPITAL_COMMUNITY)
Admission: RE | Admit: 2014-09-02 | Discharge: 2014-09-02 | Disposition: A | Discharge status: Home / Self Care | Payer: BC Managed Care – PPO | Source: Ambulatory Visit | Attending: Cardiology | Admitting: Cardiology

## 2014-09-02 DIAGNOSIS — Z5189 Encounter for other specified aftercare: Secondary | ICD-10-CM | POA: Diagnosis not present

## 2014-09-04 ENCOUNTER — Encounter (HOSPITAL_COMMUNITY): Payer: BLUE CROSS/BLUE SHIELD

## 2014-09-07 ENCOUNTER — Encounter (HOSPITAL_COMMUNITY)
Admission: RE | Admit: 2014-09-07 | Discharge: 2014-09-07 | Disposition: A | Discharge status: Home / Self Care | Payer: BLUE CROSS/BLUE SHIELD | Source: Ambulatory Visit | Attending: Cardiology | Admitting: Cardiology

## 2014-09-07 DIAGNOSIS — I200 Unstable angina: Secondary | ICD-10-CM | POA: Insufficient documentation

## 2014-09-07 DIAGNOSIS — F17200 Nicotine dependence, unspecified, uncomplicated: Secondary | ICD-10-CM | POA: Insufficient documentation

## 2014-09-07 DIAGNOSIS — I10 Essential (primary) hypertension: Secondary | ICD-10-CM | POA: Diagnosis not present

## 2014-09-07 DIAGNOSIS — Z5189 Encounter for other specified aftercare: Secondary | ICD-10-CM | POA: Insufficient documentation

## 2014-09-07 DIAGNOSIS — E669 Obesity, unspecified: Secondary | ICD-10-CM | POA: Insufficient documentation

## 2014-09-07 DIAGNOSIS — I2510 Atherosclerotic heart disease of native coronary artery without angina pectoris: Secondary | ICD-10-CM | POA: Insufficient documentation

## 2014-09-07 DIAGNOSIS — Z6835 Body mass index (BMI) 35.0-35.9, adult: Secondary | ICD-10-CM | POA: Insufficient documentation

## 2014-09-07 DIAGNOSIS — E785 Hyperlipidemia, unspecified: Secondary | ICD-10-CM | POA: Insufficient documentation

## 2014-09-07 DIAGNOSIS — E780 Pure hypercholesterolemia: Secondary | ICD-10-CM | POA: Insufficient documentation

## 2014-09-07 DIAGNOSIS — Z955 Presence of coronary angioplasty implant and graft: Secondary | ICD-10-CM | POA: Diagnosis not present

## 2014-09-09 ENCOUNTER — Encounter (HOSPITAL_COMMUNITY)
Admission: RE | Admit: 2014-09-09 | Discharge: 2014-09-09 | Disposition: A | Discharge status: Home / Self Care | Payer: BLUE CROSS/BLUE SHIELD | Source: Ambulatory Visit | Attending: Cardiology | Admitting: Cardiology

## 2014-09-09 DIAGNOSIS — Z5189 Encounter for other specified aftercare: Secondary | ICD-10-CM | POA: Diagnosis not present

## 2014-09-11 ENCOUNTER — Encounter (HOSPITAL_COMMUNITY)
Admission: RE | Admit: 2014-09-11 | Discharge: 2014-09-11 | Disposition: A | Discharge status: Home / Self Care | Payer: BLUE CROSS/BLUE SHIELD | Source: Ambulatory Visit | Attending: Cardiology | Admitting: Cardiology

## 2014-09-11 DIAGNOSIS — Z5189 Encounter for other specified aftercare: Secondary | ICD-10-CM | POA: Diagnosis not present

## 2014-09-14 ENCOUNTER — Encounter (HOSPITAL_COMMUNITY)
Admission: RE | Admit: 2014-09-14 | Discharge: 2014-09-14 | Disposition: A | Discharge status: Home / Self Care | Payer: BLUE CROSS/BLUE SHIELD | Source: Ambulatory Visit | Attending: Cardiology | Admitting: Cardiology

## 2014-09-14 DIAGNOSIS — Z5189 Encounter for other specified aftercare: Secondary | ICD-10-CM | POA: Diagnosis not present

## 2014-09-16 ENCOUNTER — Encounter (HOSPITAL_COMMUNITY)
Admission: RE | Admit: 2014-09-16 | Discharge: 2014-09-16 | Disposition: A | Discharge status: Home / Self Care | Payer: BLUE CROSS/BLUE SHIELD | Source: Ambulatory Visit | Attending: Cardiology | Admitting: Cardiology

## 2014-09-16 DIAGNOSIS — Z5189 Encounter for other specified aftercare: Secondary | ICD-10-CM | POA: Diagnosis not present

## 2014-09-18 ENCOUNTER — Encounter (HOSPITAL_COMMUNITY)
Admission: RE | Admit: 2014-09-18 | Discharge: 2014-09-18 | Disposition: A | Discharge status: Home / Self Care | Payer: BLUE CROSS/BLUE SHIELD | Source: Ambulatory Visit | Attending: Cardiology | Admitting: Cardiology

## 2014-09-21 ENCOUNTER — Encounter (HOSPITAL_COMMUNITY): Payer: BLUE CROSS/BLUE SHIELD

## 2014-09-23 ENCOUNTER — Encounter (HOSPITAL_COMMUNITY)
Admission: RE | Admit: 2014-09-23 | Discharge: 2014-09-23 | Disposition: A | Discharge status: Home / Self Care | Payer: BLUE CROSS/BLUE SHIELD | Source: Ambulatory Visit | Attending: Cardiology | Admitting: Cardiology

## 2014-09-23 DIAGNOSIS — Z5189 Encounter for other specified aftercare: Secondary | ICD-10-CM | POA: Diagnosis not present

## 2014-09-25 ENCOUNTER — Encounter (HOSPITAL_COMMUNITY): Payer: BLUE CROSS/BLUE SHIELD

## 2014-09-28 ENCOUNTER — Encounter (HOSPITAL_COMMUNITY)
Admission: RE | Admit: 2014-09-28 | Discharge: 2014-09-28 | Disposition: A | Discharge status: Home / Self Care | Payer: BLUE CROSS/BLUE SHIELD | Source: Ambulatory Visit | Attending: Cardiology | Admitting: Cardiology

## 2014-09-28 DIAGNOSIS — Z5189 Encounter for other specified aftercare: Secondary | ICD-10-CM | POA: Diagnosis not present

## 2014-09-29 ENCOUNTER — Other Ambulatory Visit: Payer: Self-pay | Admitting: Dermatology

## 2014-09-30 ENCOUNTER — Encounter (HOSPITAL_COMMUNITY)
Admission: RE | Admit: 2014-09-30 | Discharge: 2014-09-30 | Disposition: A | Discharge status: Home / Self Care | Payer: BLUE CROSS/BLUE SHIELD | Source: Ambulatory Visit | Attending: Cardiology | Admitting: Cardiology

## 2014-09-30 DIAGNOSIS — Z5189 Encounter for other specified aftercare: Secondary | ICD-10-CM | POA: Diagnosis not present

## 2014-10-02 ENCOUNTER — Encounter (HOSPITAL_COMMUNITY)
Admission: RE | Admit: 2014-10-02 | Discharge: 2014-10-02 | Disposition: A | Discharge status: Home / Self Care | Payer: BLUE CROSS/BLUE SHIELD | Source: Ambulatory Visit | Attending: Cardiology | Admitting: Cardiology

## 2014-10-02 ENCOUNTER — Encounter (HOSPITAL_COMMUNITY): Payer: Self-pay

## 2014-10-02 DIAGNOSIS — Z5189 Encounter for other specified aftercare: Secondary | ICD-10-CM | POA: Diagnosis not present

## 2014-10-02 NOTE — Progress Notes (Signed)
Pt graduated from cardiac rehab program today with completion of 36 exercise sessions in Phase II. Pt maintained good attendance and progressed nicely during his participation in rehab as evidenced by increased MET level.    Pt feels he has adequately met his rehab goals with the exception of weight loss.  Meal choices and portion size are his biggest hinderances.   Pt given specific meal planning ideas to reduce calories and modify diet.  Written and verbal information given.  Pt congratulated on continued smoking cessation.   Medication list reconciled. Repeat  PHQ score-  0.  Pt has made significant lifestyle changes and should be commended for his success.   Pt plans to continue exercising on his own at home.

## 2014-10-05 ENCOUNTER — Encounter (HOSPITAL_COMMUNITY): Payer: BLUE CROSS/BLUE SHIELD

## 2014-10-07 ENCOUNTER — Encounter (HOSPITAL_COMMUNITY): Payer: BLUE CROSS/BLUE SHIELD

## 2014-10-08 ENCOUNTER — Telehealth: Payer: Self-pay | Admitting: *Deleted

## 2014-10-08 MED ORDER — NITROGLYCERIN 0.4 MG SL SUBL: 0.4000 mg | SUBLINGUAL_TABLET | SUBLINGUAL | Status: DC | PRN

## 2014-10-08 NOTE — Telephone Encounter (Signed)
-----   Message from Candee Furbish, MD sent at 10/08/2014  6:15 AM EST ----- Regarding: FW: cardiac rehab NTG please Candee Furbish, MD   ----- Message -----    From: Lowell Guitar, RN    Sent: 10/07/2014   7:31 AM      To: Candee Furbish, MD Subject: cardiac rehab                                  Dear Dr. Marlou Porch,  Pt has graduated from cardiac rehab program.  He had great participation and has been congratulated on his continued smoking cessation!  Pt does not have a prescription for NTG SL.   If you feel this would be appropriate for him,  please send RX to [facility] in [place].  He has not had any chest pain or symptoms since his DES in September.    Thank you, Andi Hence, RN, BSN Cardiac Pulmonary Rehab

## 2014-10-08 NOTE — Telephone Encounter (Signed)
RX sent

## 2014-10-09 ENCOUNTER — Encounter (HOSPITAL_COMMUNITY): Payer: BLUE CROSS/BLUE SHIELD

## 2014-10-12 ENCOUNTER — Encounter (HOSPITAL_COMMUNITY): Payer: BLUE CROSS/BLUE SHIELD

## 2014-10-14 ENCOUNTER — Encounter (HOSPITAL_COMMUNITY): Payer: BLUE CROSS/BLUE SHIELD

## 2014-10-16 ENCOUNTER — Encounter (HOSPITAL_COMMUNITY): Payer: BLUE CROSS/BLUE SHIELD

## 2014-10-19 ENCOUNTER — Encounter (HOSPITAL_COMMUNITY): Payer: BLUE CROSS/BLUE SHIELD

## 2014-10-21 ENCOUNTER — Encounter (HOSPITAL_COMMUNITY): Payer: BLUE CROSS/BLUE SHIELD

## 2014-10-23 ENCOUNTER — Encounter (HOSPITAL_COMMUNITY): Payer: BLUE CROSS/BLUE SHIELD

## 2014-11-19 ENCOUNTER — Ambulatory Visit (INDEPENDENT_AMBULATORY_CARE_PROVIDER_SITE_OTHER): Payer: BLUE CROSS/BLUE SHIELD | Admitting: Cardiology

## 2014-11-19 ENCOUNTER — Other Ambulatory Visit: Payer: Self-pay | Admitting: *Deleted

## 2014-11-19 ENCOUNTER — Encounter: Payer: Self-pay | Admitting: Cardiology

## 2014-11-19 VITALS — BP 110/68 | HR 74 | Ht 70.0 in | Wt 254.0 lb

## 2014-11-19 DIAGNOSIS — K5190 Ulcerative colitis, unspecified, without complications: Secondary | ICD-10-CM

## 2014-11-19 DIAGNOSIS — E7800 Pure hypercholesterolemia, unspecified: Secondary | ICD-10-CM

## 2014-11-19 DIAGNOSIS — I2510 Atherosclerotic heart disease of native coronary artery without angina pectoris: Secondary | ICD-10-CM

## 2014-11-19 NOTE — Patient Instructions (Signed)
The current medical regimen is effective;  continue present plan and medications.  Follow up in 6 months with Dr. Marlou Porch.  You will receive a letter in the mail 2 months before you are due.  Please call us when you receive this letter to schedule your follow up appointment.  Thank you for choosing [facility]!!

## 2014-11-19 NOTE — Progress Notes (Signed)
[facility]. [address], [address] [place], [place]  [address] Phone: [phone] Fax:  [phone]  Date:  11/19/2014   ID:  KEARNEY EVITT, DOB 03-18-1961, MRN [id number]  PCP:  Gennette Pac, MD   History of Present Illness: Howard Lawrence is a 54 y.o. male with coronary artery disease, high-grade proximal LAD lesion after nuclear stress test demonstrate an anteroseptal ischemia, drug eluding stent 3.0 x 23 mm Xience DES to prox LAD, EF 45% on 05/08/14.   Symptoms previously occurred at [facility]. Cardiac rehabilitation. Low-dose beta blocker was started because of mildly reduced ejection fraction. No signs of heart failure. Feeling well, no chest pain. Occasionally he will have shortness of breath when laying down with activity but he has gained more weight.  Was getting a weird sensation in chest wall but now subsided.   He has a multitude of cardiac risk factors including smoking, hypertension, hyperlipidemia. He has ulcerative colitis. In review of [facility] notes, he was dismissed from the GI practice, Dr. Cristina Gong, for noncompliance. He is seeing GI in [place].     Wt Readings from Last 3 Encounters:  11/19/14 254 lb (115.214 kg)  07/21/14 252 lb (114.306 kg)  06/25/14 249 lb 9 oz (113.2 kg)     Past medical history: Depression and anxiety Ulcerative colitis in adenomatous colon polyps Tobacco use Hypertension Basal cell carcinoma of the face removed by Dr. Sarajane Jews in 2015 Alcohol use.  Past Surgical History  Procedure Laterality Date  . Skin cancer removal from right side of face    . Coronary stent placement  05/08/2014    DES to LAD         Dr Irish Lack  . Left heart catheterization with coronary angiogram N/A 05/08/2014    Procedure: LEFT HEART CATHETERIZATION WITH CORONARY ANGIOGRAM;  Surgeon: Candee Furbish, MD;  Location: [facility] C[facility] LAB;  Service: Cardiovascular;  Laterality: N/A;  . Percutaneous coronary stent intervention (pci-s)  05/08/2014    Procedure: PERCUTANEOUS  CORONARY STENT INTERVENTION (PCI-S);  Surgeon: Candee Furbish, MD;  Location: [facility] CATH LAB;  Service: Cardiovascular;;    Current Outpatient Prescriptions  Medication Sig Dispense Refill  . Ascorbic Acid (VITAMIN C PO) Take 1 tablet by mouth daily.    Marland Kitchen aspirin 81 MG tablet Take 81 mg by mouth daily.    Marland Kitchen atorvastatin (LIPITOR) 80 MG tablet Take 80 mg by mouth daily.    Marland Kitchen b complex vitamins capsule Take 1 capsule by mouth daily.    Marland Kitchen buPROPion (WELLBUTRIN XL) 300 MG 24 hr tablet Take 300 mg by mouth daily.    . fluorouracil (EFUDEX) 5 % cream   0  . losartan-hydrochlorothiazide (HYZAAR) 50-12.5 MG per tablet Take 1 tablet by mouth daily.    . metoprolol succinate (TOPROL-XL) 25 MG 24 hr tablet Take 0.5 tablets (12.5 mg total) by mouth at bedtime. 30 tablet 11  . Multiple Vitamin (MULTIVITAMIN) capsule Take 1 capsule by mouth daily.    . nitroGLYCERIN (NITROSTAT) 0.4 MG SL tablet Place 1 tablet (0.4 mg total) under the tongue every 5 (five) minutes as needed for chest pain. 25 tablet prn  . Omega-3 Fatty Acids (OMEGA 3 PO) Take 1,000 mg by mouth daily.     Marland Kitchen omeprazole (PRILOSEC) 20 MG capsule Take 20 mg by mouth daily.    . sertraline (ZOLOFT) 100 MG tablet Take 200 mg by mouth daily.     . ticagrelor (BRILINTA) 90 MG TABS tablet Take 1 tablet (  90 mg total) by mouth 2 (two) times daily. 60 tablet 10   No current facility-administered medications for this visit.    Allergies:   No Known Allergies  Social History:  The patient  reports that he quit smoking about 8 months ago. He started smoking about 9 months ago. He has never used smokeless tobacco. He reports that he does not drink alcohol or use illicit drugs.  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 Family History  Problem Relation Age of Onset  . Arthritis/Rheumatoid Mother   . Liver disease Father   . Lung cancer Paternal Grandmother   . Heart attack Neg Hx   . Stroke Neg Hx    His  father died of pancreatic cancer status post Whipple procedure, no early family history of coronary artery disease.  ROS:  Please see the history of present illness.  Positive for shortness of breath when laying down and with activity, muscle pain, easy bruising. Denies any bleeding, syncope, fevers, chills, orthopnea, PND.   All other systems reviewed and negative.   PHYSICAL EXAM: VS:  BP 110/68 mmHg  Pulse 74  Ht 5' 10"  (7.628 m)  Wt 254 lb (115.214 kg)  BMI 36.45 kg/m2 Well nourished, well developed, in no acute distress HEENT: normal, [place]/AT, EOMI Neck: no JVD, normal carotid upstroke, no bruit Cardiac:  normal S1, S2; RRR; no murmur Lungs:  clear to auscultation bilaterally, no wheezing, rhonchi or rales Abd: soft, nontender, no hepatomegaly, no bruitsoverweight Ext: no edema, 2+ distal pulses Skin: warm and dry GU: deferred Neuro: no focal abnormalities noted, AAO x 3  EKG:  03/18/14-sinus rhythm, no other abnormalities. 04/27/14-sinus rhythm, no other allergies-no change from prior  Labs: Hemoglobin A1c 5.4, hemoglobin 15.2, creatinine 1.4, TSH 2.2, LDL 134, triglycerides 422, previous triglycerides 126 with LDL of 127     ASSESSMENT AND PLAN:  1. Coronary artery disease-proximal LAD stent-dual antiplatelet therapy and importance discussed. Continue for 1 year. He would need to postpone colonoscopy until after 05/09/15.  2. Hypertension-good control. Doing well.  Tolerating low-dose Toprol. 3. Hyperlipidemia-atorvastatin, check lipids. ALT.  4. Obesity-continue to encourage weight loss.decreasing carbohydrates 5. Ulcerative colitis- gastroenterologist in [place]. Postpone colonoscopywith bx  until after 05/09/15 (one year Brilinta needed post stent). Risk of stent thrombosis.  6. 6 month follow up.   Signed, Candee Furbish, MD [facility]  11/19/2014 9:43 AM

## 2015-05-25 ENCOUNTER — Telehealth: Payer: Self-pay | Admitting: Cardiology

## 2015-05-25 NOTE — Telephone Encounter (Signed)
Pt will discuss when he comes in on Thursday for his appt.

## 2015-05-25 NOTE — Telephone Encounter (Signed)
New message     Pt has question regarding brilinta. Pt not sure if he is to continue taking  Please call to discuss

## 2015-05-27 ENCOUNTER — Encounter: Payer: Self-pay | Admitting: Cardiology

## 2015-05-27 ENCOUNTER — Ambulatory Visit (INDEPENDENT_AMBULATORY_CARE_PROVIDER_SITE_OTHER): Payer: BLUE CROSS/BLUE SHIELD | Admitting: Cardiology

## 2015-05-27 VITALS — BP 122/82 | HR 54 | Ht 70.0 in | Wt 251.1 lb

## 2015-05-27 DIAGNOSIS — Z79899 Other long term (current) drug therapy: Secondary | ICD-10-CM | POA: Diagnosis not present

## 2015-05-27 DIAGNOSIS — I10 Essential (primary) hypertension: Secondary | ICD-10-CM

## 2015-05-27 DIAGNOSIS — E7800 Pure hypercholesterolemia, unspecified: Secondary | ICD-10-CM

## 2015-05-27 DIAGNOSIS — I2510 Atherosclerotic heart disease of native coronary artery without angina pectoris: Secondary | ICD-10-CM | POA: Diagnosis not present

## 2015-05-27 DIAGNOSIS — E669 Obesity, unspecified: Secondary | ICD-10-CM

## 2015-05-27 DIAGNOSIS — I2583 Coronary atherosclerosis due to lipid rich plaque: Principal | ICD-10-CM

## 2015-05-27 NOTE — Progress Notes (Addendum)
[facility]. [address] [place], [place]  [address] Phone: [phone] Fax:  [phone]  Date:  05/27/2015   ID:  Howard Lawrence, DOB April 04, 1961, MRN [id number]  PCP:  Gennette Pac, MD   History of Present Illness: Howard Lawrence is a 54 y.o. male with coronary artery disease, high-grade proximal LAD lesion after nuclear stress test demonstrate an anteroseptal ischemia, drug eluding stent 3.0 x 23 mm Xience DES to prox LAD, EF 45% on 05/08/14.   Symptoms previously occurred at [facility]. Cardiac rehabilitation. Low-dose beta blocker was started because of mildly reduced ejection fraction. No signs of heart failure. Feeling well, no chest pain.   He has a multitude of cardiac risk factors including smoking, hypertension, hyperlipidemia. He has ulcerative colitis. In review of [facility] notes, he was dismissed from the GI practice, Dr. Cristina Gong, for noncompliance. He is seeing GI in [place].   He has been doing well since his stent placement. No chest pain, no shortness of breath, no fevers chills bleeding.    Wt Readings from Last 3 Encounters:  05/27/15 251 lb 1.9 oz (113.907 kg)  11/19/14 254 lb (115.214 kg)  07/21/14 252 lb (114.306 kg)     Past medical history: Depression and anxiety Ulcerative colitis in adenomatous colon polyps Tobacco use Hypertension Basal cell carcinoma of the face removed by Dr. Sarajane Jews in 2015 Alcohol use.  Past Surgical History  Procedure Laterality Date  . Skin cancer removal from right side of face    . Coronary stent placement  05/08/2014    DES to LAD         Dr Irish Lack  . Left heart catheterization with coronary angiogram N/A 05/08/2014    Procedure: LEFT HEART CATHETERIZATION WITH CORONARY ANGIOGRAM;  Surgeon: Candee Furbish, MD;  Location: [facility] CATH LAB;  Service: Cardiovascular;  Laterality: N/A;  . Percutaneous coronary stent intervention (pci-s)  05/08/2014    Procedure: PERCUTANEOUS CORONARY STENT INTERVENTION (PCI-S);  Surgeon: Candee Furbish, MD;  Location: [facility] CATH LAB;  Service: Cardiovascular;;    Current Outpatient Prescriptions  Medication Sig Dispense Refill  . Ascorbic Acid (VITAMIN C PO) Take 1 tablet by mouth daily.    Marland Kitchen aspirin 81 MG tablet Take 81 mg by mouth daily.    Marland Kitchen atorvastatin (LIPITOR) 80 MG tablet Take 80 mg by mouth daily.    Marland Kitchen b complex vitamins capsule Take 1 capsule by mouth daily.    Marland Kitchen buPROPion (WELLBUTRIN XL) 300 MG 24 hr tablet Take 300 mg by mouth daily.    . fluorouracil (EFUDEX) 5 % cream   0  . losartan-hydrochlorothiazide (HYZAAR) 50-12.5 MG per tablet Take 1 tablet by mouth daily.    . metoprolol succinate (TOPROL-XL) 25 MG 24 hr tablet Take 0.5 tablets (12.5 mg total) by mouth at bedtime. 30 tablet 11  . Multiple Vitamin (MULTIVITAMIN) capsule Take 1 capsule by mouth daily.    . nitroGLYCERIN (NITROSTAT) 0.4 MG SL tablet Place 1 tablet (0.4 mg total) under the tongue every 5 (five) minutes as needed for chest pain. 25 tablet prn  . Omega-3 Fatty Acids (OMEGA 3 PO) Take 1,000 mg by mouth daily.     Marland Kitchen omeprazole (PRILOSEC) 20 MG capsule Take 20 mg by mouth daily.    . sertraline (ZOLOFT) 100 MG tablet Take 200 mg by mouth daily.     . ticagrelor (BRILINTA) 90 MG TABS tablet Take 1 tablet (90 mg total) by mouth 2 (two) times daily.  60 tablet 10   No current facility-administered medications for this visit.    Allergies:   No Known Allergies  Social History:  The patient  reports that he quit smoking about 14 months ago. He started smoking about 15 months ago. He has never used smokeless tobacco. He reports that he does not drink alcohol or use illicit drugs.  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 Family History  Problem Relation Age of Onset  . Arthritis/Rheumatoid Mother   . Liver disease Father   . Lung cancer Paternal Grandmother   . Heart attack Neg Hx   . Stroke Neg Hx    His father died of pancreatic cancer status post Whipple  procedure, no early family history of coronary artery disease.  ROS:  Please see the history of present illness.  Positive for shortness of breath when laying down and with activity, muscle pain, easy bruising. Denies any bleeding, syncope, fevers, chills, orthopnea, PND.   All other systems reviewed and negative.   PHYSICAL EXAM: VS:  BP 122/82 mmHg  Pulse 54  Ht 5' 10"  (1.778 m)  Wt 251 lb 1.9 oz (113.907 kg)  BMI 36.03 kg/m2 Well nourished, well developed, in no acute distress HEENT: normal, [place]/AT, EOMI Neck: no JVD, normal carotid upstroke, no bruit Cardiac:  normal S1, S2; RRR; no murmur Lungs:  clear to auscultation bilaterally, no wheezing, rhonchi or rales Abd: soft, nontender, no hepatomegaly, no bruitsoverweight Ext: no edema, 2+ distal pulses Skin: warm and dry GU: deferred Neuro: no focal abnormalities noted, AAO x 3  EKG:   today 05/27/15-sinus rhythm, 70, no other abnormalities. Personally viewed-prior 03/18/14-sinus rhythm, no other abnormalities. 04/27/14-sinus rhythm, no other allergies-no change from prior  Labs: Hemoglobin A1c 5.4, hemoglobin 15.2, creatinine 1.4, TSH 2.2, LDL 134, triglycerides 422, previous triglycerides 126 with LDL of 127     ASSESSMENT AND PLAN:  1. Coronary artery disease-proximal LAD stent-it is been greater than 1 year. It is okay for him to discontinue his Proventil. Continue aspirin lifelong. 2. Hypertension-good control. Doing well.  Tolerating low-dose Toprol. 3. Hyperlipidemia-atorvastatin 80 mg. Last LDL was above goal of 70 at 93. Continue with diet, exercise in conjunction with high intensity statin. 4. Obesity-continue to encourage weight loss.decreasing carbohydrates 5. Ulcerative colitis- gastroenterologist in [place]. Postpone colonoscopywith bx  until after 05/09/15 (one year Brilinta needed post stent).  6. 6 month follow up.   Signed, Candee Furbish, MD [facility]  05/27/2015 8:25 AM

## 2015-05-27 NOTE — Patient Instructions (Signed)
Medication Instructions:  STOP BRILINTA   Labwork: NONE  Testing/Procedures: NONE  Follow-Up: Your physician wants you to follow-up in: 6 month ov You will receive a reminder letter in the mail two months in advance. If you don't receive a letter, please call our office to schedule the follow-up appointment.  Any Other Special Instructions Will Be Listed Below (If Applicable). Continue to work on weight loss

## 2015-06-05 ENCOUNTER — Other Ambulatory Visit: Payer: Self-pay | Admitting: Physician Assistant

## 2015-10-05 ENCOUNTER — Other Ambulatory Visit: Payer: Self-pay | Admitting: Internal Medicine

## 2015-10-08 ENCOUNTER — Other Ambulatory Visit: Payer: Self-pay | Admitting: Internal Medicine

## 2015-10-08 DIAGNOSIS — K5000 Crohn's disease of small intestine without complications: Secondary | ICD-10-CM

## 2015-11-05 ENCOUNTER — Other Ambulatory Visit: Payer: BLUE CROSS/BLUE SHIELD

## 2015-11-11 ENCOUNTER — Encounter: Payer: Self-pay | Admitting: Cardiology

## 2015-11-11 ENCOUNTER — Ambulatory Visit (INDEPENDENT_AMBULATORY_CARE_PROVIDER_SITE_OTHER): Payer: BLUE CROSS/BLUE SHIELD | Admitting: Cardiology

## 2015-11-11 VITALS — BP 118/64 | HR 92 | Ht 70.0 in | Wt 250.8 lb

## 2015-11-11 DIAGNOSIS — E669 Obesity, unspecified: Secondary | ICD-10-CM | POA: Diagnosis not present

## 2015-11-11 DIAGNOSIS — I2583 Coronary atherosclerosis due to lipid rich plaque: Principal | ICD-10-CM

## 2015-11-11 DIAGNOSIS — I2510 Atherosclerotic heart disease of native coronary artery without angina pectoris: Secondary | ICD-10-CM

## 2015-11-11 DIAGNOSIS — E7800 Pure hypercholesterolemia, unspecified: Secondary | ICD-10-CM

## 2015-11-11 NOTE — Patient Instructions (Signed)
Medication Instructions:  The current medical regimen is effective;  continue present plan and medications.  Follow-Up: Follow up in 1 year with Dr. Marlou Porch.  You will receive a letter in the mail 2 months before you are due.  Please call us when you receive this letter to schedule your follow up appointment.  If you need a refill on your cardiac medications before your next appointment, please call your pharmacy.  Thank you for choosing [facility]!!

## 2015-11-11 NOTE — Progress Notes (Signed)
[facility]. [address], [address] [place], [place]  [address] Phone: [phone] Fax:  [phone]  Date:  11/11/2015   ID:  Howard [facility], DOB 07-23-1961, MRN [id number]  PCP:  Gennette Pac, MD   History of Present Illness: Howard [facility] is a 55 y.o. male with coronary artery disease, high-grade proximal LAD lesion after nuclear stress test demonstrate an anteroseptal ischemia, drug eluding stent 3.0 x 23 mm Xience DES to prox LAD, EF 45% on 05/08/14.   Symptoms previously occurred at [facility]. Cardiac rehabilitation. Low-dose beta blocker was started because of mildly reduced ejection fraction. No signs of heart failure. Feeling well, no chest pain.   He has a multitude of cardiac risk factors including smoking, hypertension, hyperlipidemia. He has ulcerative colitis. In review of [facility] notes, he was dismissed from the GI practice, Dr. Cristina Gong, for noncompliance. He is seeing GI in [place].   He has been doing well since his stent placement. Slight sensation across chest wall, maybe a quarter of sensation. Like crawling around. No chest pain, no shortness of breath, no fevers chills bleeding.    Wt Readings from Last 3 Encounters:  11/11/15 250 lb 12.8 oz (113.762 kg)  05/27/15 251 lb 1.9 oz (113.907 kg)  11/19/14 254 lb (115.214 kg)     Past medical history: Depression and anxiety Ulcerative colitis in adenomatous colon polyps Tobacco use Hypertension Basal cell carcinoma of the face removed by Dr. Sarajane Jews in 2015 Alcohol use.  Past Surgical History  Procedure Laterality Date  . Skin cancer removal from right side of face    . Coronary stent placement  05/08/2014    DES to LAD         Dr Irish Lack  . Left heart catheterization with coronary angiogram N/A 05/08/2014    Procedure: LEFT HEART CATHETERIZATION WITH CORONARY ANGIOGRAM;  Surgeon: Candee Furbish, MD;  Location: [facility] CATH LAB;  Service: Cardiovascular;  Laterality: N/A;  . Percutaneous coronary stent intervention  (pci-s)  05/08/2014    Procedure: PERCUTANEOUS CORONARY STENT INTERVENTION (PCI-S);  Surgeon: Candee Furbish, MD;  Location: [facility] CATH LAB;  Service: Cardiovascular;;    Current Outpatient Prescriptions  Medication Sig Dispense Refill  . aspirin 81 MG tablet Take 81 mg by mouth daily.    Marland Kitchen atorvastatin (LIPITOR) 80 MG tablet Take 80 mg by mouth daily.    Marland Kitchen b complex vitamins capsule Take 1 capsule by mouth daily.    Marland Kitchen buPROPion (WELLBUTRIN XL) 300 MG 24 hr tablet Take 300 mg by mouth daily.    Marland Kitchen losartan-hydrochlorothiazide (HYZAAR) 50-12.5 MG per tablet Take 1 tablet by mouth daily.    . metoprolol succinate (TOPROL-XL) 25 MG 24 hr tablet TAKE 1/2 A TABLET BY MOUTH AT BEDTIME 30 tablet 5  . Multiple Vitamin (MULTIVITAMIN) capsule Take 1 capsule by mouth daily.    . nitroGLYCERIN (NITROSTAT) 0.4 MG SL tablet Place 1 tablet (0.4 mg total) under the tongue every 5 (five) minutes as needed for chest pain. 25 tablet prn  . Omega-3 Fatty Acids (OMEGA 3 PO) Take 1,000 mg by mouth daily.     Marland Kitchen omeprazole (PRILOSEC) 20 MG capsule Take 20 mg by mouth daily.    . sertraline (ZOLOFT) 100 MG tablet Take 200 mg by mouth daily.      No current facility-administered medications for this visit.    Allergies:   No Known Allergies  Social History:  The patient  reports that he quit smoking about 20  months ago. He started smoking about 21 months ago. He has never used smokeless tobacco. He reports that he does not drink alcohol or use illicit drugs.  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 Family History  Problem Relation Age of Onset  . Arthritis/Rheumatoid Mother   . Liver disease Father   . Lung cancer Paternal Grandmother   . Heart attack Neg Hx   . Stroke Neg Hx    His father died of pancreatic cancer status post Whipple procedure, no early family history of coronary artery disease.  ROS:  Please see the history of present illness.  Positive for shortness  of breath when laying down and with activity, muscle pain, easy bruising. Denies any bleeding, syncope, fevers, chills, orthopnea, PND.   All other systems reviewed and negative.   PHYSICAL EXAM: VS:  BP 118/64 mmHg  Pulse 92  Ht 5' 10"  (1.778 m)  Wt 250 lb 12.8 oz (113.762 kg)  BMI 35.99 kg/m2 Well nourished, well developed, in no acute distress HEENT: normal, [place]/AT, EOMI Neck: no JVD, normal carotid upstroke, no bruit Cardiac:  normal S1, S2; RRR; no murmur Lungs:  clear to auscultation bilaterally, no wheezing, rhonchi or rales Abd: soft, nontender, no hepatomegaly, no bruitsoverweight Ext: no edema, 2+ distal pulses Skin: warm and dry GU: deferred Neuro: no focal abnormalities noted, AAO x 3  EKG:   today 05/27/15-sinus rhythm, 70, no other abnormalities. Personally viewed-prior 03/18/14-sinus rhythm, no other abnormalities. 04/27/14-sinus rhythm, no other allergies-no change from prior  Labs: Hemoglobin A1c 5.4, hemoglobin 15.2, creatinine 1.4, TSH 2.2, LDL 134, triglycerides 422, previous triglycerides 126 with LDL of 127     ASSESSMENT AND PLAN:  1. Coronary artery disease-proximal LAD stent-no longer on Plavix.. Continue aspirin lifelong. Doing well. Atypical chest discomfort at times. No longer. 2. Hypertension-good control. Doing well.  Tolerating low-dose Toprol. 3. Hyperlipidemia-atorvastatin 80 mg. Last LDL was 59. Continue with diet, exercise in conjunction with high intensity statin. 4. Obesity-continue to encourage weight loss.decreasing carbohydrates 5. Ulcerative colitis- gastroenterologist in [place].   6. 12 month follow up.   Signed, Candee Furbish, MD [facility]  11/11/2015 4:12 PM

## 2015-11-26 ENCOUNTER — Ambulatory Visit
Admission: RE | Admit: 2015-11-26 | Discharge: 2015-11-26 | Disposition: A | Discharge status: Home / Self Care | Payer: BLUE CROSS/BLUE SHIELD | Source: Ambulatory Visit | Attending: Internal Medicine | Admitting: Internal Medicine

## 2015-11-26 DIAGNOSIS — K5000 Crohn's disease of small intestine without complications: Secondary | ICD-10-CM

## 2016-04-18 ENCOUNTER — Other Ambulatory Visit: Payer: Self-pay | Admitting: Cardiology

## 2016-04-19 ENCOUNTER — Other Ambulatory Visit: Payer: Self-pay | Admitting: Acute Care

## 2016-04-19 DIAGNOSIS — Z87891 Personal history of nicotine dependence: Secondary | ICD-10-CM

## 2016-05-17 ENCOUNTER — Ambulatory Visit (INDEPENDENT_AMBULATORY_CARE_PROVIDER_SITE_OTHER): Payer: BLUE CROSS/BLUE SHIELD | Admitting: Acute Care

## 2016-05-17 ENCOUNTER — Encounter (INDEPENDENT_AMBULATORY_CARE_PROVIDER_SITE_OTHER): Payer: Self-pay

## 2016-05-17 ENCOUNTER — Encounter: Payer: Self-pay | Admitting: Acute Care

## 2016-05-17 ENCOUNTER — Ambulatory Visit (INDEPENDENT_AMBULATORY_CARE_PROVIDER_SITE_OTHER)
Admission: RE | Admit: 2016-05-17 | Discharge: 2016-05-17 | Disposition: A | Discharge status: Home / Self Care | Payer: BLUE CROSS/BLUE SHIELD | Source: Ambulatory Visit | Attending: Acute Care | Admitting: Acute Care

## 2016-05-17 DIAGNOSIS — Z87891 Personal history of nicotine dependence: Secondary | ICD-10-CM

## 2016-05-17 NOTE — Progress Notes (Signed)
Shared Decision Making Visit Lung Cancer Screening Program [id number])   Eligibility:  Age 55 y.o.  Pack Years Smoking History Calculation 30 + pack year smoking history (# packs/per year x # years smoked)  Recent History of coughing up blood  no  Unexplained weight loss? no ( >Than 15 pounds within the last 6 months )  Prior History Lung / other cancer no (Diagnosis within the last 5 years already requiring surveillance chest CT Scans).  Smoking Status Former Smoker  Former Smokers: Years since quit: 2 years  Quit Date: 03/2014  Visit Components:  Discussion included one or more decision making aids. yes  Discussion included risk/benefits of screening. yes  Discussion included potential follow up diagnostic testing for abnormal scans. yes  Discussion included meaning and risk of over diagnosis. yes  Discussion included meaning and risk of False Positives. yes  Discussion included meaning of total radiation exposure. yes  Counseling Included:  Importance of adherence to annual lung cancer LDCT screening. yes  Impact of comorbidities on ability to participate in the program. yes  Ability and willingness to under diagnostic treatment. yes  Smoking Cessation Counseling:  Current Smokers:   Discussed importance of smoking cessation NA; former smoker  Information about tobacco cessation classes and interventions provided to patient. yes  Patient provided with "ticket" for LDCT Scan. yes  Symptomatic Patient. no  Counseling  Diagnosis Code: Tobacco Use Z72.0  Asymptomatic Patient yes  Counseling NA; former smoker  Former Smokers:   Discussed the importance of maintaining cigarette abstinence. yes  Diagnosis Code: Personal History of Nicotine Dependence. S56.812  Information about tobacco cessation classes and interventions provided to patient. Yes  Patient provided with "ticket" for LDCT Scan. yes  Written Order for Lung Cancer Screening with LDCT placed  in Epic. Yes (CT Chest Lung Cancer Screening Low Dose W/O CM) [id number] Z12.2-Screening of respiratory organs Z87.891-Personal history of nicotine dependence  I spent 20 minutes of face to face time with Mr. Tollison discussing the risks and benefits of lung cancer screening. We viewed a power point together that explained in detail the above noted topics. We took the time to pause the power point at intervals to allow for questions to be asked and answered to ensure understanding. We discussed that he had taken the single most powerful action possible to decrease his risk of developing lung cancer when he quit smoking. I counseled him to remain smoke free, and to contact me if he ever had the desire to smoke again so that I can provide resources and tools to help support the effort to remain smoke free. We discussed the time and location of the scan, and that either [place] or I will call with the results within  24-48 hours of receiving them. Mr. Greis has my card and contact information in the event he needs to speak with me, in addition to a copy of the power point we reviewed as a resource. He verbalized understanding of all of the above and had no further questions upon leaving the office.    Magdalen Spatz, NP 05/17/2016

## 2016-05-18 ENCOUNTER — Telehealth: Payer: Self-pay | Admitting: Acute Care

## 2016-05-18 DIAGNOSIS — Z87891 Personal history of nicotine dependence: Secondary | ICD-10-CM

## 2016-05-18 NOTE — Telephone Encounter (Signed)
I have called Mr. Howard Lawrence with the results of his CT chest lung cancer screening. I explained to him that his scan was read as a Lung RADS 1, negative study: no nodules or definitely benign nodules. Radiology recommendation is for a repeat LDCT in 12 months. I explained the recommendation was for him to have her repeat scan in 12 months per radiology recommendation and the program protocol. I did explain to him that there was a notation of coronary artery calcification. The patient is currently taking a statin, and has had a stent placed in the past by Dr. Candee Furbish. I told him that I will fax results of his scan to his primary care provider, he can follow-up as they feel is clinically indicated. Mr. [facility] verbalized understanding of the above and had no further questions at the time the call was completed. He has my contact information in the event he has any further questions in the future.

## 2016-06-20 ENCOUNTER — Other Ambulatory Visit: Payer: Self-pay | Admitting: Cardiology

## 2016-10-27 ENCOUNTER — Other Ambulatory Visit: Payer: Self-pay | Admitting: Cardiology

## 2016-10-30 NOTE — Progress Notes (Signed)
[facility] [address] [place], [place] [address] Phone: [phone] Subjective:       CC: hand pain   ASN:[id number]  Howard Lawrence is a 56 y.o. male coming in with complaint of Hand pain mostly the thumb. Patient states that this and going on for some time. Seems to be worsening recently. Patient is very active. Patient states that unfortunately has noticed that it is affecting some of his daily activities. Avoiding certain things such as gripping on a regular basis. Has to take more breaks when he is doing more physical labor. Denies any numbness. Does not notice any weakness. Throbbing sensation at night. Has tried bracing with no improvement.     Past Medical History:  Diagnosis Date  . Coronary artery disease   . GERD (gastroesophageal reflux disease)   . Hypertension   . Ulcerative colitis [facility])    Past Surgical History:  Procedure Laterality Date  . CORONARY STENT PLACEMENT  05/08/2014   DES to LAD         Dr Irish Lack  . LEFT HEART CATHETERIZATION WITH CORONARY ANGIOGRAM N/A 05/08/2014   Procedure: LEFT HEART CATHETERIZATION WITH CORONARY ANGIOGRAM;  Surgeon: Candee Furbish, MD;  Location: [facility] CATH LAB;  Service: Cardiovascular;  Laterality: N/A;  . PERCUTANEOUS CORONARY STENT INTERVENTION (PCI-S)  05/08/2014   Procedure: PERCUTANEOUS CORONARY STENT INTERVENTION (PCI-S);  Surgeon: Candee Furbish, MD;  Location: [facility] CATH LAB;  Service: Cardiovascular;;  . skin cancer removal from right side of face     Social History   Social History  . Marital status: Married    Spouse name: N/A  . Number of children: N/A  . Years of education: N/A   Social History Main Topics  . Smoking status: Former Smoker    Packs/day: 1.00    Years: 30.00    Types: Cigarettes    Start date: 02/02/2014    Quit date: 03/11/2014  . Smokeless tobacco: Never Used     Comment: Encouraged to remain smoke free  . Alcohol use No  . Drug use: No  . Sexual activity: Not on file    Other Topics Concern  . Not on file   Social History Narrative  . No narrative on file   No Known Allergies Family History  Problem Relation Age of Onset  . Arthritis/Rheumatoid Mother   . Liver disease Father   . Lung cancer Paternal Grandmother   . Heart attack Neg Hx   . Stroke Neg Hx     Past medical history, social, surgical and family history all reviewed in electronic medical record.  No pertanent information unless stated regarding to the chief complaint.   Review of Systems:Review of systems updated and as accurate as of 10/30/16  No headache, visual changes, nausea, vomiting, diarrhea, constipation, dizziness, abdominal pain, skin rash, fevers, chills, night sweats, weight loss, swollen lymph nodes, body aches, joint swelling, muscle aches, chest pain, shortness of breath, mood changes.   Objective  There were no vitals taken for this visit. Systems examined below as of 10/30/16   General: No apparent distress alert and oriented x3 mood and affect normal, dressed appropriately.  HEENT: Pupils equal, extraocular movements intact  Respiratory: Patient's speak in full sentences and does not appear short of breath  Cardiovascular: No lower extremity edema, non tender, no erythema  Skin: Warm dry intact with no signs of infection or rash on extremities or on axial skeleton.  Abdomen: Soft nontender  Neuro: Cranial  nerves II through XII are intact, neurovascularly intact in all extremities with 2+ DTRs and 2+ pulses.  Lymph: No lymphadenopathy of posterior or anterior cervical chain or axillae bilaterally.  Gait normal with good balance and coordination.  MSK:  Non tender with full range of motion and good stability and symmetric strength and tone of shoulders, elbows, wrist, hip, knee and ankles bilaterally.  Hand exam shows the patient does have mild crepitus with range of motion of the thumb. Positive grind test. Negative Finkelstein's. Negative Tinel's test. Good grip  strength. Neurovascularly intact distally.  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 Patient's abductor pollicis longus tendon has no significant hypoechoic changes. No muscle injury noted. No ligamentous injury. Impression: CMC arthritis   Impression and Recommendations:     This case required medical decision making of moderate complexity.      Note: This dictation was prepared with Dragon dictation along with smaller phrase technology. Any transcriptional errors that result from this process are unintentional.

## 2016-10-31 ENCOUNTER — Ambulatory Visit (INDEPENDENT_AMBULATORY_CARE_PROVIDER_SITE_OTHER): Payer: BLUE CROSS/BLUE SHIELD | Admitting: Family Medicine

## 2016-10-31 ENCOUNTER — Encounter: Payer: Self-pay | Admitting: Family Medicine

## 2016-10-31 ENCOUNTER — Ambulatory Visit: Payer: Self-pay

## 2016-10-31 VITALS — BP 116/72 | HR 71 | Ht 70.0 in | Wt 250.0 lb

## 2016-10-31 DIAGNOSIS — M79641 Pain in right hand: Secondary | ICD-10-CM

## 2016-10-31 DIAGNOSIS — M1811 Unilateral primary osteoarthritis of first carpometacarpal joint, right hand: Secondary | ICD-10-CM

## 2016-10-31 MED ORDER — DICLOFENAC SODIUM 2 % TD SOLN: 2.0000 "application " | Freq: Two times a day (BID) | TRANSDERMAL | 3 refills | Status: DC

## 2016-10-31 MED ORDER — VITAMIN D (ERGOCALCIFEROL) 1.25 MG (50000 UNIT) PO CAPS: 50000.0000 [IU] | ORAL_CAPSULE | ORAL | 0 refills | Status: DC

## 2016-10-31 NOTE — Assessment & Plan Note (Signed)
Patient December the [facility] arthritis. Encourage bracing at night, once weekly vitamin D given for patient's ulcerative colitis though would likely help with any type of progression of the arthritis. Patient given topical anti-inflammatory for pain relief. We discussed icing regimen. Patient will see how these changes help. At follow-up worsening symptoms we'll consider injection and formal physical therapy. Could be a candidate for custom bracing as well.

## 2016-10-31 NOTE — Patient Instructions (Addendum)
Good to see you  Ice is your friend Ice 20 minutes 2 times daily. Usually after activity and before bed. Once weekly vitamin D for next 12 weeks.  Calcium 2 pills daily instead of 4 pennsaid pinkie amount topically 2 times daily as needed.  Tylenol 511m 3 times daily  Tart cherry extract any dose at night Stop the turmeric if no help Wear the brace at night See me again in 3-4 weeks and we will see how you are doing.

## 2016-11-23 ENCOUNTER — Ambulatory Visit (INDEPENDENT_AMBULATORY_CARE_PROVIDER_SITE_OTHER): Payer: BLUE CROSS/BLUE SHIELD | Admitting: Family Medicine

## 2016-11-23 ENCOUNTER — Ambulatory Visit: Payer: Self-pay

## 2016-11-23 ENCOUNTER — Encounter: Payer: Self-pay | Admitting: Family Medicine

## 2016-11-23 VITALS — BP 108/72 | HR 96 | Ht 70.0 in | Wt 246.6 lb

## 2016-11-23 DIAGNOSIS — M1811 Unilateral primary osteoarthritis of first carpometacarpal joint, right hand: Secondary | ICD-10-CM

## 2016-11-23 DIAGNOSIS — M19049 Primary osteoarthritis, unspecified hand: Secondary | ICD-10-CM

## 2016-11-23 NOTE — Patient Instructions (Addendum)
Good to see yo u Wear the brace maybe for the next 2 dys then nightly for 2 weeks.  Ice in 6 hours.  Should get better every day for the next 2 weeks.  See me again in 4-6 weeks to make sure you are doing well.

## 2016-11-23 NOTE — Assessment & Plan Note (Signed)
Given injection today. Tolerated the procedure well. We discussed icing regimen and home exercises. Topical anti-inflammatories given again. I do believe the patient will do relatively well with conservative therapy. Patient does have the history of ulcerative colitis and we may need to do further imaging for any erosive changes of patient does not make improvement. Patient come back in 4 weeks.

## 2016-11-23 NOTE — Progress Notes (Signed)
[facility] [address], [place] [address] Phone: [phone] Subjective:       CC: hand pain f/u   CHE:NIDPOEUMPN  QUANDARIUS Howard Lawrence is a 56 y.o. male coming in with complaint of Hand pain mostly the thumb. Patient was seen previously and did have more of a [place] arthritis. Started with the topical anti-inflammatories, icing regimen, as well as wearing a brace. States that he is approximately 25% better. Still though affecting daily activities. Having difficulty with true healing. Patient states that intermittently it seems to be swelling. Sometimes can wake him up at night still.     Past Medical History:  Diagnosis Date  . Coronary artery disease   . GERD (gastroesophageal reflux disease)   . Hypertension   . Ulcerative colitis [facility])    Past Surgical History:  Procedure Laterality Date  . CORONARY STENT PLACEMENT  05/08/2014   DES to LAD         Dr Irish Lack  . LEFT HEART CATHETERIZATION WITH CORONARY ANGIOGRAM N/A 05/08/2014   Procedure: LEFT HEART CATHETERIZATION WITH CORONARY ANGIOGRAM;  Surgeon: Candee Furbish, MD;  Location: [facility] CATH LAB;  Service: Cardiovascular;  Laterality: N/A;  . PERCUTANEOUS CORONARY STENT INTERVENTION (PCI-S)  05/08/2014   Procedure: PERCUTANEOUS CORONARY STENT INTERVENTION (PCI-S);  Surgeon: Candee Furbish, MD;  Location: [facility] CATH LAB;  Service: Cardiovascular;;  . skin cancer removal from right side of face     Social History   Social History  . Marital status: Married    Spouse name: N/A  . Number of children: N/A  . Years of education: N/A   Social History Main Topics  . Smoking status: Former Smoker    Packs/day: 1.00    Years: 30.00    Types: Cigarettes    Start date: 02/02/2014    Quit date: 03/11/2014  . Smokeless tobacco: Never Used     Comment: Encouraged to remain smoke free  . Alcohol use No  . Drug use: No  . Sexual activity: Not Asked   Other Topics Concern  . None   Social History Narrative  . None     No Known Allergies Family History  Problem Relation Age of Onset  . Arthritis/Rheumatoid Mother   . Liver disease Father   . Lung cancer Paternal Grandmother   . Heart attack Neg Hx   . Stroke Neg Hx     Past medical history, social, surgical and family history all reviewed in electronic medical record.  No pertanent information unless stated regarding to the chief complaint.   Review of Systems: No headache, visual changes, nausea, vomiting, diarrhea, constipation, dizziness, abdominal pain, skin rash, fevers, chills, night sweats, weight loss, swollen lymph nodes, body aches, joint swelling, muscle aches, chest pain, shortness of breath, mood changes.    Objective  Blood pressure 108/72, pulse 96, height 5' 10"  (1.778 m), weight 246 lb 9.6 oz (111.9 kg), SpO2 95 %.   Systems examined below as of 11/23/16 General: NAD A&O x3 mood, affect normal  HEENT: Pupils equal, extraocular movements intact no nystagmus Respiratory: not short of breath at rest or with speaking Cardiovascular: No lower extremity edema, non tender Skin: Warm dry intact with no signs of infection or rash on extremities or on axial skeleton. Abdomen: Soft nontender, no masses Neuro: Cranial nerves  intact, neurovascularly intact in all extremities with 2+ DTRs and 2+ pulses. Lymph: No lymphadenopathy appreciated today  Gait normal with good balance and coordination.  MSK:  Non tender with full range of motion and good stability and symmetric strength and tone of shoulders, elbows, wrist,  knee hips and ankles bilaterally.   Hand exam shows the patient does have mild crepitus with range of motion of the thumb. Positive grind test with increasing swelling compared to previous exam. Mild limitation in range of motion at this time. Neurovascular intact distally. Good strength noted.. Overlying lipoma on the palmar aspect of the thumb freely movable in minimally tender.  Procedure: Real-time Ultrasound Guided Injection  of right CMC joint Device: GE Logiq Q7 Ultrasound guided injection is preferred based studies that show increased duration, increased effect, greater accuracy, decreased procedural pain, increased response rate, and decreased cost with ultrasound guided versus blind injection.  Verbal informed consent obtained.  Time-out conducted.  Noted no overlying erythema, induration, or other signs of local infection.  Skin prepped in a sterile fashion.  Local anesthesia: Topical Ethyl chloride.  With sterile technique and under real time ultrasound guidance:  The 25-gauge half-inch needle patient was injected with a total of 0.5 mL of 0.5% Marcaine and 0.5 mL of Kenalog 40 mg/dL. Completed without difficulty  Pain immediately resolved suggesting accurate placement of the medication.  Advised to call if fevers/chills, erythema, induration, drainage, or persistent bleeding.  Images permanently stored and available for review in the ultrasound unit.  Impression: Technically successful ultrasound guided injection.   Impression and Recommendations:     This case required medical decision making of moderate complexity.      Note: This dictation was prepared with Dragon dictation along with smaller phrase technology. Any transcriptional errors that result from this process are unintentional.

## 2016-11-26 ENCOUNTER — Other Ambulatory Visit: Payer: Self-pay | Admitting: Cardiology

## 2016-11-27 ENCOUNTER — Encounter: Payer: Self-pay | Admitting: Cardiology

## 2016-11-27 ENCOUNTER — Ambulatory Visit (INDEPENDENT_AMBULATORY_CARE_PROVIDER_SITE_OTHER): Payer: BLUE CROSS/BLUE SHIELD | Admitting: Cardiology

## 2016-11-27 VITALS — BP 115/68 | HR 63 | Ht 70.0 in | Wt 247.0 lb

## 2016-11-27 DIAGNOSIS — I2510 Atherosclerotic heart disease of native coronary artery without angina pectoris: Secondary | ICD-10-CM | POA: Diagnosis not present

## 2016-11-27 DIAGNOSIS — I10 Essential (primary) hypertension: Secondary | ICD-10-CM

## 2016-11-27 DIAGNOSIS — K51918 Ulcerative colitis, unspecified with other complication: Secondary | ICD-10-CM | POA: Diagnosis not present

## 2016-11-27 DIAGNOSIS — E7800 Pure hypercholesterolemia, unspecified: Secondary | ICD-10-CM | POA: Diagnosis not present

## 2016-11-27 DIAGNOSIS — I209 Angina pectoris, unspecified: Secondary | ICD-10-CM

## 2016-11-27 MED ORDER — METOPROLOL SUCCINATE ER 25 MG PO TB24: 12.5000 mg | ORAL_TABLET | Freq: Every day | ORAL | 3 refills | Status: DC

## 2016-11-27 NOTE — Patient Instructions (Signed)
Medication Instructions:  The current medical regimen is effective;  continue present plan and medications.  Follow-Up: Follow up in 1 year with Dr. Marlou Porch.  You will receive a letter in the mail 2 months before you are due.  Please call us when you receive this letter to schedule your follow up appointment.  If you need a refill on your cardiac medications before your next appointment, please call your pharmacy.  Thank you for choosing [facility]!!

## 2016-11-27 NOTE — Progress Notes (Signed)
Cardiology Office Note    Date:  11/27/2016   ID:  KRISTOPHER ATTWOOD, DOB 1961-05-20, MRN [id number]  PCP:  Gennette Pac, MD  Cardiologist:   Candee Furbish, MD     History of Present Illness:  Howard Lawrence is a 56 y.o. male with coronary artery disease, high-grade proximal LAD lesion following nuclear stress test with anteroseptal ischemia drug eluding stent 3.0 x 23 mm Xience DES to prox LAD, EF 45% on 05/08/14.   Symptoms previously occurred at [facility]. Cardiac rehabilitation. Low-dose beta blocker was started because of mildly reduced ejection fraction. No signs of heart failure. Feeling well, no chest pain.   He has a multitude of cardiac risk factors including smoking, hypertension, hyperlipidemia. He has ulcerative colitis. In review of [facility] notes, he was dismissed from the GI practice, Dr. Cristina Gong, for noncompliance. He is seeing GI in [place].    Middle of chest, air bubble like, balloon.     Past Medical History:  Diagnosis Date  . Coronary artery disease   . GERD (gastroesophageal reflux disease)   . Hypertension   . Ulcerative colitis [facility])     Past Surgical History:  Procedure Laterality Date  . CORONARY STENT PLACEMENT  05/08/2014   DES to LAD         Dr Irish Lack  . LEFT HEART CATHETERIZATION WITH CORONARY ANGIOGRAM N/A 05/08/2014   Procedure: LEFT HEART CATHETERIZATION WITH CORONARY ANGIOGRAM;  Surgeon: Candee Furbish, MD;  Location: [facility] CATH LAB;  Service: Cardiovascular;  Laterality: N/A;  . PERCUTANEOUS CORONARY STENT INTERVENTION (PCI-S)  05/08/2014   Procedure: PERCUTANEOUS CORONARY STENT INTERVENTION (PCI-S);  Surgeon: Candee Furbish, MD;  Location: [facility] CATH LAB;  Service: Cardiovascular;;  . skin cancer removal from right side of face      Current Medications: Outpatient Medications Prior to Visit  Medication Sig Dispense Refill  . aspirin 81 MG tablet Take 81 mg by mouth daily.    Marland Kitchen atorvastatin (LIPITOR) 80 MG tablet Take 80 mg by mouth daily.    Marland Kitchen b  complex vitamins capsule Take 1 capsule by mouth daily.    Marland Kitchen buPROPion (WELLBUTRIN XL) 300 MG 24 hr tablet Take 300 mg by mouth daily.    . Diclofenac Sodium (PENNSAID) 2 % SOLN Place 2 application onto the skin 2 (two) times daily. 112 g 3  . losartan-hydrochlorothiazide (HYZAAR) 50-12.5 MG per tablet Take 1 tablet by mouth daily.    . meloxicam (MOBIC) 15 MG tablet Take 15 mg by mouth daily.  1  . Multiple Vitamin (MULTIVITAMIN) capsule Take 1 capsule by mouth daily.    Marland Kitchen NITROSTAT 0.4 MG SL tablet PLACE 1 TABLET (0.4 MG TOTAL) UNDER THE TONGUE EVERY 5 (FIVE) MINUTES AS NEEDED FOR CHEST PAIN. 25 tablet 5  . Omega-3 Fatty Acids (OMEGA 3 PO) Take 1,000 mg by mouth daily.     Marland Kitchen omeprazole (PRILOSEC) 20 MG capsule Take 20 mg by mouth daily.    . sertraline (ZOLOFT) 100 MG tablet Take 200 mg by mouth daily.     . Vitamin D, Ergocalciferol, (DRISDOL) 50000 units CAPS capsule Take 1 capsule (50,000 Units total) by mouth every 7 (seven) days. 12 capsule 0  . metoprolol succinate (TOPROL-XL) 25 MG 24 hr tablet Take 0.5 tablets (12.5 mg total) by mouth at bedtime. *Please call and schedule a one year follow up appointment* 15 tablet 0   No facility-administered medications prior to visit.      Allergies:   Patient has no  known allergies.   Social History   Social History  . Marital status: Married    Spouse name: N/A  . Number of children: N/A  . Years of education: N/A   Social History Main Topics  . Smoking status: Former Smoker    Packs/day: 1.00    Years: 30.00    Types: Cigarettes    Start date: 02/02/2014    Quit date: 03/11/2014  . Smokeless tobacco: Never Used     Comment: Encouraged to remain smoke free  . Alcohol use No  . Drug use: No  . Sexual activity: Not Asked   Other Topics Concern  . None   Social History Narrative  . None     Family History:  The patient's family history includes Arthritis/Rheumatoid in his mother; Liver disease in his father; Lung cancer in his  paternal grandmother.   ROS:   Please see the history of present illness.    ROS All other systems reviewed and are negative.   PHYSICAL EXAM:   VS:  BP 115/68   Pulse 63   Ht 5' 10"  (1.778 m)   Wt 247 lb (112 kg)   BMI 35.44 kg/m    GEN: Well nourished, well developed, in no acute distress  HEENT: nl  Neck: no JVD, carotid bruits, or masses Cardiac: RRR; no murmurs, rubs, or gallops,no edema  Respiratory:  clear to auscultation bilaterally, normal work of breathing GI: soft, nontender, nondistended, + BS, obese MS: no deformity or atrophy  Skin: warm and dry, no rash Neuro:  Alert and Oriented x 3, Strength and sensation are intact Psych: euthymic mood, full affect  Wt Readings from Last 3 Encounters:  11/27/16 247 lb (112 kg)  11/23/16 246 lb 9.6 oz (111.9 kg)  10/31/16 250 lb (113.4 kg)      Studies/Labs Reviewed:   EKG:  EKG is ordered today.  11/27/16-sinus rhythm, 63 with no other abnormalities. Personally viewed.  Recent Labs: No results found for requested labs within last 8760 hours.   Lipid Panel    Component Value Date/Time   CHOL 170 07/21/2014 1028   TRIG 339.0 (H) 07/21/2014 1028   HDL 40.00 07/21/2014 1028   CHOLHDL 4 07/21/2014 1028   VLDL 67.8 (H) 07/21/2014 1028   LDLDIRECT 93.0 07/21/2014 1028    Additional studies/ records that were reviewed today include:   Cardiac catheterization 05/08/14-DES to proximal LAD, EF 45%. This was following ischemia on stress test.    ASSESSMENT:    1. Atherosclerosis of native coronary artery of native heart, angina presence unspecified   2. Essential hypertension, benign   3. Ulcerative colitis with other complication, unspecified location ([place])   4. Pure hypercholesterolemia   5. Angina pectoris ([place])   6. Morbid obesity ([place])      PLAN:  In order of problems listed above:  Coronary artery disease  - Proximal LAD stent currently in place. Aspirin. Completed 1 year of dual antiplatelet therapy.   - Coronary artery calcification noted on CT lung cancer screening 05/17/16. Likely stent.  Angina  - Has had chest pain off and on but overall reasonably well controlled. Continue with beta blocker, Toprol.  - His "gas bubble "lasting 30 seconds in the center of his chest is quite atypical. Could be GI in etiology. He states that it is 10% avoid he felt when he was having exertional symptoms. We will continue to monitor this.  Morbid obesity  - Continue to encourage weight loss. BMI greater  than 35 with 2 co morbidities. Discussed.  Essential hypertension  - Doing well. Blood pressure controlled.  Hyperlipidemia  - Atorvastatin 80  Ulcerative colitis  - He has a GI Rondall Allegra    Medication Adjustments/Labs and Tests Ordered: Current medicines are reviewed at length with the patient today.  Concerns regarding medicines are outlined above.  Medication changes, Labs and Tests ordered today are listed in the Patient Instructions below. Patient Instructions  Medication Instructions:  The current medical regimen is effective;  continue present plan and medications.  Follow-Up: Follow up in 1 year with Dr. Marlou Porch.  You will receive a letter in the mail 2 months before you are due.  Please call us when you receive this letter to schedule your follow up appointment.  If you need a refill on your cardiac medications before your next appointment, please call your pharmacy.  Thank you for choosing [facility]!!        Signed, Candee Furbish, MD  11/27/2016 9:03 AM    [facility] [place], [place], [place]  [address] Phone: [phone]; Fax: [phone]

## 2017-01-03 ENCOUNTER — Encounter: Payer: Self-pay | Admitting: Family Medicine

## 2017-01-03 ENCOUNTER — Ambulatory Visit (INDEPENDENT_AMBULATORY_CARE_PROVIDER_SITE_OTHER): Payer: BLUE CROSS/BLUE SHIELD | Admitting: Family Medicine

## 2017-01-03 ENCOUNTER — Ambulatory Visit (INDEPENDENT_AMBULATORY_CARE_PROVIDER_SITE_OTHER)
Admission: RE | Admit: 2017-01-03 | Discharge: 2017-01-03 | Disposition: A | Discharge status: Home / Self Care | Payer: BLUE CROSS/BLUE SHIELD | Source: Ambulatory Visit | Attending: Family Medicine | Admitting: Family Medicine

## 2017-01-03 VITALS — BP 102/76 | HR 74 | Resp 16 | Wt 242.0 lb

## 2017-01-03 DIAGNOSIS — M1811 Unilateral primary osteoarthritis of first carpometacarpal joint, right hand: Secondary | ICD-10-CM | POA: Diagnosis not present

## 2017-01-03 DIAGNOSIS — M79641 Pain in right hand: Secondary | ICD-10-CM | POA: Diagnosis not present

## 2017-01-03 MED ORDER — IBUPROFEN-FAMOTIDINE 800-26.6 MG PO TABS: 1.0000 | ORAL_TABLET | Freq: Three times a day (TID) | ORAL | 3 refills | Status: DC | PRN

## 2017-01-03 NOTE — Patient Instructions (Addendum)
Good to see you  Xray downstairs today  Got you a custom brace today  Duexis 3 times a day as needed for pain  Tylenol 560m 3 times a day  See me again in 3-4 weeks and if not better we will do another injection

## 2017-01-03 NOTE — Assessment & Plan Note (Signed)
Worsening arthritis of left thumb. Discussed with patient at great length. We discussed the possibility of a custom brace which she was fitted for today. We discussed continuing the topical anti-inflammatories and once weekly vitamin D. Discussed the possibility of a repeat injection again in 4 weeks if necessary. Given a oral anti-inflammatory that may be also beneficial. Hand x-ray to make sure there is no other underlying bone abnormality with patient having the overlying lipoma potentially being missed on the ultrasound. Follow-up again in 3-6 weeks.

## 2017-01-03 NOTE — Progress Notes (Signed)
Pre-visit discussion using our clinic review tool. No additional management support is needed unless otherwise documented below in the visit note.  

## 2017-01-03 NOTE — Progress Notes (Signed)
[facility] [place],  [address] Phone: [phone] Subjective:       CC: hand pain f/u   [id number]  Howard Lawrence is a 56 y.o. male coming in with complaint of Hand pain mostly the thumb. Patient was seen previously and did have more of a [place] arthritis. Started with the topical anti-inflammatories, icing regimen, as well as wearing a brace. Patient did not have any good resolution of pain after the last injection. Patient has been doing a lot more overall. Patient states in with increasing activity seems to be more painful.    Past Medical History:  Diagnosis Date  . Coronary artery disease   . GERD (gastroesophageal reflux disease)   . Hypertension   . Ulcerative colitis [facility])    Past Surgical History:  Procedure Laterality Date  . CORONARY STENT PLACEMENT  05/08/2014   DES to LAD         Dr Irish Lack  . LEFT HEART CATHETERIZATION WITH CORONARY ANGIOGRAM N/A 05/08/2014   Procedure: LEFT HEART CATHETERIZATION WITH CORONARY ANGIOGRAM;  Surgeon: Candee Furbish, MD;  Location: [facility] CATH LAB;  Service: Cardiovascular;  Laterality: N/A;  . PERCUTANEOUS CORONARY STENT INTERVENTION (PCI-S)  05/08/2014   Procedure: PERCUTANEOUS CORONARY STENT INTERVENTION (PCI-S);  Surgeon: Candee Furbish, MD;  Location: [facility] CATH LAB;  Service: Cardiovascular;;  . skin cancer removal from right side of face     Social History   Social History  . Marital status: Married    Spouse name: N/A  . Number of children: N/A  . Years of education: N/A   Social History Main Topics  . Smoking status: Former Smoker    Packs/day: 1.00    Years: 30.00    Types: Cigarettes    Start date: 02/02/2014    Quit date: 03/11/2014  . Smokeless tobacco: Never Used     Comment: Encouraged to remain smoke free  . Alcohol use No  . Drug use: No  . Sexual activity: Not Asked   Other Topics Concern  . None   Social History Narrative  . None   No Known Allergies Family History    Problem Relation Age of Onset  . Arthritis/Rheumatoid Mother   . Liver disease Father   . Lung cancer Paternal Grandmother   . Heart attack Neg Hx   . Stroke Neg Hx     Past medical history, social, surgical and family history all reviewed in electronic medical record.  No pertanent information unless stated regarding to the chief complaint.   Review of Systems: No headache, visual changes, nausea, vomiting, diarrhea, constipation, dizziness, abdominal pain, skin rash, fevers, chills, night sweats, weight loss, swollen lymph nodes, body aches, joint swelling, muscle aches, chest pain, shortness of breath, mood changes.     Objective  Blood pressure 102/76, pulse 74, resp. rate 16, weight 242 lb (109.8 kg), SpO2 96 %.   Systems examined below as of 01/03/17 General: NAD A&O x3 mood, affect normal  HEENT: Pupils equal, extraocular movements intact no nystagmus Respiratory: not short of breath at rest or with speaking Cardiovascular: No lower extremity edema, non tender Skin: Warm dry intact with no signs of infection or rash on extremities or on axial skeleton. Abdomen: Soft nontender, no masses Neuro: Cranial nerves  intact, neurovascularly intact in all extremities with 2+ DTRs and 2+ pulses. Lymph: No lymphadenopathy appreciated today  Gait normal with good balance and coordination.  MSK: Non tender with full range of  motion and good stability and symmetric strength and tone of shoulders, elbows, wrist,  knee hips and ankles bilaterally.   Hand exam shows the patient does have mild crepitus with range of motion of the thumb. Positive grind test with increasing swelling compared to previous exam. Mild limitation in range of motion at this time. Neurovascular intact distally. Good strength noted.. Overlying lipoma still noted. Mild increase in size. Tender to palpation but is still freely movable.     Impression and Recommendations:     This case required medical decision making  of moderate complexity.      Note: This dictation was prepared with Dragon dictation along with smaller phrase technology. Any transcriptional errors that result from this process are unintentional.

## 2017-01-19 ENCOUNTER — Other Ambulatory Visit: Payer: Self-pay | Admitting: Family Medicine

## 2017-01-31 ENCOUNTER — Ambulatory Visit (INDEPENDENT_AMBULATORY_CARE_PROVIDER_SITE_OTHER): Payer: BLUE CROSS/BLUE SHIELD | Admitting: Family Medicine

## 2017-01-31 ENCOUNTER — Other Ambulatory Visit: Payer: Self-pay

## 2017-01-31 ENCOUNTER — Encounter: Payer: Self-pay | Admitting: Family Medicine

## 2017-01-31 ENCOUNTER — Ambulatory Visit: Payer: Self-pay

## 2017-01-31 VITALS — BP 110/70 | HR 64 | Ht 70.0 in | Wt 241.0 lb

## 2017-01-31 DIAGNOSIS — M1811 Unilateral primary osteoarthritis of first carpometacarpal joint, right hand: Secondary | ICD-10-CM

## 2017-01-31 DIAGNOSIS — M79641 Pain in right hand: Secondary | ICD-10-CM

## 2017-01-31 MED ORDER — GABAPENTIN 100 MG PO CAPS: 200.0000 mg | ORAL_CAPSULE | Freq: Every day | ORAL | 3 refills | Status: DC

## 2017-01-31 NOTE — Patient Instructions (Addendum)
Good to see you  Alvera Singh is your friend.  I am so sorry we have not made improvement.  We will get you in with Dr. Grandville Silos to discuss options Start gabapentin 223m at night to help with any nerve pain.  I am here if you have questions but I do not have a lot of options at this time.

## 2017-01-31 NOTE — Assessment & Plan Note (Signed)
Has failed conservative therapy at this point. Discussed with patient again at great length about different treatment options. Patient is willing to discuss with a hand surgeon about potential replacement. This is affecting daily activities as well as his likelihood. Encourage patient to continue to potentially stay active. If he wants and try another injection on for more than willing to help. We will refill his ibuprofen as needed. Given some gabapentin help with any other pain relief. Follow-up again as needed

## 2017-01-31 NOTE — Progress Notes (Signed)
[facility] [place], [place] [address] Phone: [phone] Subjective:       CC: hand pain f/u   [id number]  Howard Lawrence is a 56 y.o. male coming in with complaint of Hand pain mostly the thumb. Patient was seen previously and did have more of a [place] arthritis.Patient did have x-rays at last follow-up confirming the severe advanced CMC arthritis. Patient was given another injection 3 weeks ago. Patient states Unfortunately the injection did not seem to help at all. Continues to have significant amount of pain. Patient describes pain as a note, throbbing aching sensation. Patient states it is affecting daily activities. Has been wearing the custom brace with very minimal benefit as well.    Past Medical History:  Diagnosis Date  . Coronary artery disease   . GERD (gastroesophageal reflux disease)   . Hypertension   . Ulcerative colitis [facility])    Past Surgical History:  Procedure Laterality Date  . CORONARY STENT PLACEMENT  05/08/2014   DES to LAD         Dr Irish Lack  . LEFT HEART CATHETERIZATION WITH CORONARY ANGIOGRAM N/A 05/08/2014   Procedure: LEFT HEART CATHETERIZATION WITH CORONARY ANGIOGRAM;  Surgeon: Candee Furbish, MD;  Location[facility] CATH LAB;  Service: Cardiovascular;  Laterality: N/A;  . PERCUTANEOUS CORONARY STENT INTERVENTION (PCI-S)  05/08/2014   Procedure: PERCUTANEOUS CORONARY STENT INTERVENTION (PCI-S);  Surgeon: Candee Furbish, MD;  Location: [facility] CATH LAB;  Service: Cardiovascular;;  . skin cancer removal from right side of face     Social History   Social History  . Marital status: Married    Spouse name: N/A  . Number of children: N/A  . Years of education: N/A   Social History Main Topics  . Smoking status: Former Smoker    Packs/day: 1.00    Years: 30.00    Types: Cigarettes    Start date: 02/02/2014    Quit date: 03/11/2014  . Smokeless tobacco: Never Used     Comment: Encouraged to remain smoke free  . Alcohol use No    . Drug use: No  . Sexual activity: Not Asked   Other Topics Concern  . None   Social History Narrative  . None   No Known Allergies Family History  Problem Relation Age of Onset  . Arthritis/Rheumatoid Mother   . Liver disease Father   . Lung cancer Paternal Grandmother   . Heart attack Neg Hx   . Stroke Neg Hx     Past medical history, social, surgical and family history all reviewed in electronic medical record.  No pertanent information unless stated regarding to the chief complaint.   Review of Systems: No headache, visual changes, nausea, vomiting, diarrhea, constipation, dizziness, abdominal pain, skin rash, fevers, chills, night sweats, weight loss, swollen lymph nodes, body aches,  muscle aches, chest pain, shortness of breath, mood changes.  Positive hand joint pain   Objective  Blood pressure 110/70, pulse 64, height 5' 10"  (1.778 m), weight 241 lb (109.3 kg).   Systems examined below as of 01/31/17 General: NAD A&O x3 mood, affect normal  HEENT: Pupils equal, extraocular movements intact no nystagmus Respiratory: not short of breath at rest or with speaking Cardiovascular: No lower extremity edema, non tender Skin: Warm dry intact with no signs of infection or rash on extremities or on axial skeleton. Abdomen: Soft nontender, no masses Neuro: Cranial nerves  intact, neurovascularly intact in all extremities with 2+ DTRs and  2+ pulses. Lymph: No lymphadenopathy appreciated today  Gait normal with good balance and coordination.  MSK: Non tender with full range of motion and good stability and symmetric strength and tone of shoulders, elbows, wrist,  knee hips and ankles bilaterally.    Hand exam shows the patient does have mild crepitus with range of motion of the thumb. Positive grind test Less inflammation and swelling than previous exam. No masses appreciated. Severe pain though over the [facility] joint with palpation.      Impression and Recommendations:      This case required medical decision making of moderate complexity.      Note: This dictation was prepared with Dragon dictation along with smaller phrase technology. Any transcriptional errors that result from this process are unintentional.

## 2017-04-13 ENCOUNTER — Other Ambulatory Visit: Payer: Self-pay | Admitting: Family Medicine

## 2017-04-13 NOTE — Telephone Encounter (Signed)
Refill done.  

## 2017-05-18 ENCOUNTER — Ambulatory Visit (INDEPENDENT_AMBULATORY_CARE_PROVIDER_SITE_OTHER)
Admission: RE | Admit: 2017-05-18 | Discharge: 2017-05-18 | Disposition: A | Discharge status: Home / Self Care | Payer: BLUE CROSS/BLUE SHIELD | Source: Ambulatory Visit | Attending: Acute Care | Admitting: Acute Care

## 2017-05-18 DIAGNOSIS — Z87891 Personal history of nicotine dependence: Secondary | ICD-10-CM

## 2017-05-24 ENCOUNTER — Telehealth: Payer: Self-pay | Admitting: Acute Care

## 2017-05-24 DIAGNOSIS — Z87891 Personal history of nicotine dependence: Secondary | ICD-10-CM

## 2017-05-24 DIAGNOSIS — Z122 Encounter for screening for malignant neoplasm of respiratory organs: Secondary | ICD-10-CM

## 2017-05-25 NOTE — Telephone Encounter (Signed)
Will forward to the lung nodule pool 

## 2017-05-28 NOTE — Telephone Encounter (Signed)
LMTC x 1  

## 2017-05-29 ENCOUNTER — Other Ambulatory Visit: Payer: Self-pay | Admitting: Family Medicine

## 2017-05-29 NOTE — Telephone Encounter (Signed)
Patient is returning phone call [phone]

## 2017-05-29 NOTE — Telephone Encounter (Signed)
Refill done.  

## 2017-05-29 NOTE — Telephone Encounter (Signed)
Forwarding to lung nodule pool

## 2017-05-31 NOTE — Telephone Encounter (Signed)
Pt calling about his results.  [phone]

## 2017-05-31 NOTE — Telephone Encounter (Signed)
Pt informed of CT results per Eric Form, NP.  PT verbalized understanding.  Copy sent to PCP and Dr Marlou Porch.   Order placed for 1 yr f/u CT.

## 2017-05-31 NOTE — Telephone Encounter (Signed)
LMTC x 1  

## 2017-07-08 ENCOUNTER — Other Ambulatory Visit: Payer: Self-pay | Admitting: Family Medicine

## 2017-07-09 NOTE — Telephone Encounter (Signed)
Refill denied. Pt has completed course of treatment.  

## 2017-07-12 ENCOUNTER — Other Ambulatory Visit: Payer: Self-pay | Admitting: Family Medicine

## 2017-07-12 NOTE — Telephone Encounter (Signed)
Refill denied. Pt has completed course of treatment.  

## 2017-07-29 ENCOUNTER — Other Ambulatory Visit: Payer: Self-pay | Admitting: Family Medicine

## 2017-08-07 ENCOUNTER — Other Ambulatory Visit: Payer: Self-pay

## 2017-08-07 ENCOUNTER — Telehealth: Payer: Self-pay | Admitting: Family Medicine

## 2017-08-07 MED ORDER — IBUPROFEN-FAMOTIDINE 800-26.6 MG PO TABS: 1.0000 | ORAL_TABLET | Freq: Three times a day (TID) | ORAL | 3 refills | Status: DC | PRN

## 2017-08-07 NOTE — Telephone Encounter (Signed)
Medication refilled through [facility].

## 2017-08-07 NOTE — Telephone Encounter (Signed)
Copied from [place] [id number]. Topic: Quick Communication - See Telephone Encounter >> Aug 07, 2017 10:35 AM Cleaster Corin, NT wrote: CRM for notification. See Telephone encounter for:   08/07/17. Pt. Calling and needs refill on Duexis called pharmacy to get refill and they need another authorization from the doctor (Dr. Hulan Saas) for med.   [facility] [place], [place] [address] Phone: [phone] Fax: [phone] Not a 24 hour pharmacy; exact hours not known

## 2017-08-23 ENCOUNTER — Other Ambulatory Visit: Payer: Self-pay | Admitting: Family Medicine

## 2017-10-23 ENCOUNTER — Other Ambulatory Visit: Payer: Self-pay | Admitting: Cardiology

## 2017-10-26 ENCOUNTER — Other Ambulatory Visit: Payer: Self-pay | Admitting: Family Medicine

## 2017-11-16 ENCOUNTER — Other Ambulatory Visit: Payer: Self-pay | Admitting: *Deleted

## 2017-11-16 MED ORDER — GABAPENTIN 100 MG PO CAPS: 200.0000 mg | ORAL_CAPSULE | Freq: Every day | ORAL | 1 refills | Status: DC

## 2017-11-16 NOTE — Telephone Encounter (Signed)
Refill done.  

## 2017-11-28 ENCOUNTER — Other Ambulatory Visit: Payer: Self-pay | Admitting: Cardiology

## 2018-01-06 ENCOUNTER — Other Ambulatory Visit: Payer: Self-pay | Admitting: Cardiology

## 2018-01-16 ENCOUNTER — Other Ambulatory Visit: Payer: Self-pay | Admitting: Family Medicine

## 2018-02-09 ENCOUNTER — Other Ambulatory Visit: Payer: Self-pay | Admitting: Cardiology

## 2018-02-20 ENCOUNTER — Encounter: Payer: Self-pay | Admitting: *Deleted

## 2018-02-20 DIAGNOSIS — Z006 Encounter for examination for normal comparison and control in clinical research program: Secondary | ICD-10-CM

## 2018-02-20 NOTE — Progress Notes (Signed)
BIOFLOW-V Research study telephone 4 year follow up completed. Patient is doing well, denies chest pain and any use of ntg. Medications reviewed. Patient denies any hospitalizations or adverse events. I thanked him for his participation. Next research required follow up is due between 02/22/19-06/11/2019.

## 2018-02-23 ENCOUNTER — Other Ambulatory Visit: Payer: Self-pay | Admitting: Cardiology

## 2018-03-04 ENCOUNTER — Other Ambulatory Visit: Payer: Self-pay

## 2018-03-04 MED ORDER — METOPROLOL SUCCINATE ER 25 MG PO TB24: 12.5000 mg | ORAL_TABLET | Freq: Every day | ORAL | 0 refills | Status: DC

## 2018-03-05 ENCOUNTER — Other Ambulatory Visit: Payer: Self-pay | Admitting: Family Medicine

## 2018-03-05 NOTE — Telephone Encounter (Signed)
Refill of ibuprofen-famotidine  LOV 01/31/17  Dr. Tamala Julian  [facility] 08/07/17  #90  3 refills  [facility] [place], [place]       [phone] (Phone) [phone] (Fax)

## 2018-03-05 NOTE — Telephone Encounter (Signed)
Refill denied. Pt needs an appt.

## 2018-03-05 NOTE — Telephone Encounter (Signed)
Copied from [place] [phone]. Topic: Quick Communication - Rx Refill/Question >> Mar 05, 2018 10:14 AM Bea Graff, NT wrote: Medication: Ibuprofen-Famotidine 800-26.6 MG TABS   Has the patient contacted their pharmacy? Yes.   (Agent: If no, request that the patient contact the pharmacy for the refill.) (Agent: If yes, when and what did the pharmacy advise?)  Preferred Pharmacy (with phone number or street name): [facility] [place], [place] [phone] (Phone) [phone] (Fax)      Agent: Please be advised that RX refills may take up to 3 business days. We ask that you follow-up with your pharmacy.

## 2018-04-07 ENCOUNTER — Other Ambulatory Visit: Payer: Self-pay | Admitting: Family Medicine

## 2018-04-10 ENCOUNTER — Ambulatory Visit: Payer: BLUE CROSS/BLUE SHIELD | Admitting: Cardiology

## 2018-04-10 ENCOUNTER — Encounter: Payer: Self-pay | Admitting: Cardiology

## 2018-04-10 VITALS — BP 104/68 | HR 63 | Ht 70.0 in | Wt 229.0 lb

## 2018-04-10 DIAGNOSIS — I209 Angina pectoris, unspecified: Secondary | ICD-10-CM

## 2018-04-10 DIAGNOSIS — K51918 Ulcerative colitis, unspecified with other complication: Secondary | ICD-10-CM | POA: Diagnosis not present

## 2018-04-10 DIAGNOSIS — I2510 Atherosclerotic heart disease of native coronary artery without angina pectoris: Secondary | ICD-10-CM | POA: Diagnosis not present

## 2018-04-10 NOTE — Patient Instructions (Signed)
Medication Instructions:  Your physician recommends that you continue on your current medications as directed. Please refer to the Current Medication list given to you today.  Follow-Up: Your physician wants you to follow-up in: 1 year with Dr. Marlou Porch. You will receive a reminder letter in the mail two months in advance. If you don't receive a letter, please call our office to schedule the follow-up appointment.  If you need a refill on your cardiac medications before your next appointment, please call your pharmacy.

## 2018-04-10 NOTE — Progress Notes (Signed)
Cardiology Office Note    Date:  04/10/2018   ID:  Howard Lawrence, DOB 08-24-1961, MRN [id number]  PCP:  Hulan Fess, MD  Cardiologist:   Candee Furbish, MD     History of Present Illness:  Howard Lawrence is a 57 y.o. male with coronary artery disease, high-grade proximal LAD lesion following nuclear stress test with anteroseptal ischemia drug eluting stent 3.0 x 23 mm Xience DES to prox LAD, EF 45% on 05/08/14.   Symptoms previously occurred at [facility]. Cardiac rehabilitation. Low-dose beta blocker was started because of mildly reduced ejection fraction. No signs of heart failure. Feeling well, no chest pain.   He has a multitude of cardiac risk factors including smoking, hypertension, hyperlipidemia. He has ulcerative colitis. In review of [facility] notes, he was dismissed from the GI practice, Dr. Cristina Gong, for noncompliance. He is seeing GI in [place].    Middle of chest, air bubble like, balloon.   04/10/2018- overall doing fairly well.  No significant chest pain fevers chills nausea vomiting syncope bleeding. Eating better. No bubble like sensation.  We discussed the rationale behind stress testing.   Past Medical History:  Diagnosis Date  . Coronary artery disease   . GERD (gastroesophageal reflux disease)   . Hypertension   . Ulcerative colitis [facility])     Past Surgical History:  Procedure Laterality Date  . CORONARY STENT PLACEMENT  05/08/2014   DES to LAD         Dr Irish Lack  . LEFT HEART CATHETERIZATION WITH CORONARY ANGIOGRAM N/A 05/08/2014   Procedure: LEFT HEART CATHETERIZATION WITH CORONARY ANGIOGRAM;  Surgeon: Candee Furbish, MD;  Location: [facility] CATH LAB;  Service: Cardiovascular;  Laterality: N/A;  . PERCUTANEOUS CORONARY STENT INTERVENTION (PCI-S)  05/08/2014   Procedure: PERCUTANEOUS CORONARY STENT INTERVENTION (PCI-S);  Surgeon: Candee Furbish, MD;  Location: [facility] CATH LAB;  Service: Cardiovascular;;  . skin cancer removal from right side of face      Current  Medications: Outpatient Medications Prior to Visit  Medication Sig Dispense Refill  . aspirin 81 MG tablet Take 81 mg by mouth daily.    Marland Kitchen atorvastatin (LIPITOR) 80 MG tablet Take 80 mg by mouth daily.    Marland Kitchen buPROPion (WELLBUTRIN XL) 300 MG 24 hr tablet Take 300 mg by mouth daily.    . Diclofenac Sodium (PENNSAID) 2 % SOLN Place 2 application onto the skin 2 (two) times daily. 112 g 3  . ezetimibe (ZETIA) 10 MG tablet Take 1 mg by mouth daily.    Marland Kitchen gabapentin (NEURONTIN) 100 MG capsule Take 2 capsules (200 mg total) by mouth at bedtime. 180 capsule 1  . hydrochlorothiazide (HYDRODIURIL) 12.5 MG tablet Take 1 tablet by mouth daily.  3  . Ibuprofen-Famotidine 800-26.6 MG TABS Take 1 tablet by mouth 3 (three) times daily as needed. 90 tablet 3  . losartan-hydrochlorothiazide (HYZAAR) 50-12.5 MG per tablet Take 1 tablet by mouth daily.    . metoprolol succinate (TOPROL-XL) 25 MG 24 hr tablet Take 0.5 tablets (12.5 mg total) by mouth at bedtime. Please keep follow up appointment 04/2018 to receive further refills. Thank you. 30 tablet 0  . Multiple Vitamin (MULTIVITAMIN) capsule Take 1 capsule by mouth daily.    . nitroGLYCERIN (NITROSTAT) 0.4 MG SL tablet Place 1 tablet (0.4 mg total) under the tongue every 5 (five) minutes as needed for chest pain. Please make appt with Dr. Marlou Porch.1st attempt 25 tablet 0  . Omega-3 Fatty Acids (OMEGA 3 PO) Take 1,000 mg  by mouth daily.     . sertraline (ZOLOFT) 100 MG tablet Take 200 mg by mouth daily.     . Vitamin D, Ergocalciferol, (DRISDOL) 50000 units CAPS capsule TAKE 1 CAPSULE (50,000 UNITS TOTAL) BY MOUTH EVERY 7 (SEVEN) DAYS. 12 capsule 0  . co-enzyme Q-10 30 MG capsule Take 30 mg by mouth 3 (three) times daily.    Marland Kitchen b complex vitamins capsule Take 1 capsule by mouth daily.    Marland Kitchen omeprazole (PRILOSEC) 20 MG capsule Take 20 mg by mouth daily.     No facility-administered medications prior to visit.      Allergies:   Patient has no known allergies.    Social History   Socioeconomic History  . Marital status: Married    Spouse name: Not on file  . Number of children: Not on file  . Years of education: Not on file  . Highest education level: Not on file  Occupational History  . Not on file  Social Needs  . Financial resource strain: Not on file  . Food insecurity:    Worry: Not on file    Inability: Not on file  . Transportation needs:    Medical: Not on file    Non-medical: Not on file  Tobacco Use  . Smoking status: Former Smoker    Packs/day: 1.00    Years: 30.00    Pack years: 30.00    Types: Cigarettes    Start date: 02/02/2014    Last attempt to quit: 03/11/2014    Years since quitting: 4.0  . Smokeless tobacco: Never Used  . Tobacco comment: Encouraged to remain smoke free  Substance and Sexual Activity  . Alcohol use: No  . Drug use: No  . Sexual activity: Not on file  Lifestyle  . Physical activity:    Days per week: Not on file    Minutes per session: Not on file  . Stress: Not on file  Relationships  . Social connections:    Talks on phone: Not on file    Gets together: Not on file    Attends religious service: Not on file    Active member of club or organization: Not on file    Attends meetings of clubs or organizations: Not on file    Relationship status: Not on file  Other Topics Concern  . Not on file  Social History Narrative  . Not on file     Family History:  The patient's family history includes Arthritis/Rheumatoid in his mother; Liver disease in his father; Lung cancer in his paternal grandmother.   ROS:   Please see the history of present illness.    Review of Systems  All other systems reviewed and are negative.     PHYSICAL EXAM:   VS:  BP 104/68   Pulse 63   Ht 5' 10"  (1.778 m)   Wt 229 lb (103.9 kg)   BMI 32.86 kg/m    GEN: Well nourished, well developed, in no acute distress overweight HEENT: normal  Neck: no JVD, carotid bruits, or masses Cardiac: RRR; no murmurs,  rubs, or gallops,no edema  Respiratory:  clear to auscultation bilaterally, normal work of breathing GI: soft, nontender, nondistended, + BS MS: no deformity or atrophy  Skin: warm and dry, no rash Neuro:  Alert and Oriented x 3, Strength and sensation are intact Psych: euthymic mood, full affect   Wt Readings from Last 3 Encounters:  04/10/18 229 lb (103.9 kg)  01/31/17 241 lb (  109.3 kg)  01/03/17 242 lb (109.8 kg)      Studies/Labs Reviewed:   EKG:  EKG is ordered today.  04/10/2018-sinus rhythm 63 with no other abnormalities.  11/27/16-sinus rhythm, 63 with no other abnormalities. Personally viewed.  Recent Labs: No results found for requested labs within last 8760 hours.   Lipid Panel    Component Value Date/Time   CHOL 170 07/21/2014 1028   TRIG 339.0 (H) 07/21/2014 1028   HDL 40.00 07/21/2014 1028   CHOLHDL 4 07/21/2014 1028   VLDL 67.8 (H) 07/21/2014 1028   LDLDIRECT 93.0 07/21/2014 1028    Additional studies/ records that were reviewed today include:   Cardiac catheterization 05/08/14-DES to proximal LAD, EF 45%. This was following ischemia on stress test.    ASSESSMENT:    1. Atherosclerosis of native coronary artery of native heart, angina presence unspecified   2. Ulcerative colitis with other complication, unspecified location ([place])   3. Angina pectoris (HCC)      PLAN:  In order of problems listed above:  Coronary artery disease  - Proximal LAD stent currently in place. Aspirin. Completed 1 year of dual antiplatelet therapy.  - Coronary artery calcification noted on CT lung cancer screening 05/17/16.  Was visualizing stent.  Overall been doing quite well.  Angina  -No recent symptoms.  Improved.  Continue with antianginal therapy beta-blocker.  He has not had this "bubblelike sensation "in quite some time.  Obesity  - Continue to encourage weight loss.  Did a great job with about 20 pound weight loss.  Continue.  We spoke about diets  today.  Essential hypertension  - Doing well. Blood pressure controlled.  He is not dizzy.  Doing very well.  Hyperlipidemia  - Atorvastatin 80, last LDL 65 12/07/2017.  Personally viewed.  Ulcerative colitis  - He has a GI Rondall Allegra, this has been stable    Medication Adjustments/Labs and Tests Ordered: Current medicines are reviewed at length with the patient today.  Concerns regarding medicines are outlined above.  Medication changes, Labs and Tests ordered today are listed in the Patient Instructions below. Patient Instructions  Medication Instructions:  Your physician recommends that you continue on your current medications as directed. Please refer to the Current Medication list given to you today.  Follow-Up: Your physician wants you to follow-up in: 1 year with Dr. Marlou Porch. You will receive a reminder letter in the mail two months in advance. If you don't receive a letter, please call our office to schedule the follow-up appointment.  If you need a refill on your cardiac medications before your next appointment, please call your pharmacy.      Signed, Candee Furbish, MD  04/10/2018 11:53 AM    [facility] [address], [place], [place]  [address] Phone: [phone]; Fax: [phone]

## 2018-05-12 ENCOUNTER — Other Ambulatory Visit: Payer: Self-pay | Admitting: Cardiology

## 2018-05-18 ENCOUNTER — Other Ambulatory Visit: Payer: Self-pay | Admitting: Family Medicine

## 2018-05-20 NOTE — Telephone Encounter (Signed)
Refill denied. Pt needs an appointment.

## 2018-05-31 ENCOUNTER — Ambulatory Visit (INDEPENDENT_AMBULATORY_CARE_PROVIDER_SITE_OTHER)
Admission: RE | Admit: 2018-05-31 | Discharge: 2018-05-31 | Disposition: A | Discharge status: Home / Self Care | Payer: BLUE CROSS/BLUE SHIELD | Source: Ambulatory Visit | Attending: Acute Care | Admitting: Acute Care

## 2018-05-31 DIAGNOSIS — Z87891 Personal history of nicotine dependence: Secondary | ICD-10-CM

## 2018-05-31 DIAGNOSIS — Z122 Encounter for screening for malignant neoplasm of respiratory organs: Secondary | ICD-10-CM

## 2018-06-04 ENCOUNTER — Telehealth: Payer: Self-pay | Admitting: Acute Care

## 2018-06-04 DIAGNOSIS — Z87891 Personal history of nicotine dependence: Secondary | ICD-10-CM

## 2018-06-04 DIAGNOSIS — Z122 Encounter for screening for malignant neoplasm of respiratory organs: Secondary | ICD-10-CM

## 2018-06-06 NOTE — Telephone Encounter (Signed)
Routing message to [place] for her to follow up on.

## 2018-06-07 NOTE — Telephone Encounter (Signed)
Pt informed of CT results per Sarah Groce, NP.  PT verbalized understanding.  Copy sent to PCP.  Order placed for 1 yr f/u CT.  

## 2018-06-12 ENCOUNTER — Other Ambulatory Visit: Payer: Self-pay | Admitting: Orthopedic Surgery

## 2018-07-01 ENCOUNTER — Telehealth: Payer: Self-pay

## 2018-07-01 NOTE — Telephone Encounter (Signed)
   [facility] Pre-operative Risk Assessment    Request for surgical clearance:  1. What type of surgery is being performed? Suspensionplasty right thumb- tight rope support with tendon transfer and trapezium excision  2. When is this surgery scheduled? 08/29/2018   3. What type of clearance is required (medical clearance vs. Pharmacy clearance to hold med vs. Both)? medical  4. Are there any medications that need to be held prior to surgery and how long? ASA 81 mg  5. Practice name and name of physician performing surgery? [facility]/ Dr. Daryll Brod   6. What is your office phone number [phone]    7.   What is your office fax number [phone]  8.   Anesthesia type (None, local, MAC, general) ? Axillary block   Howard Lawrence 07/01/2018, 10:39 AM  _________________________________________________________________   (provider comments below)

## 2018-07-01 NOTE — Telephone Encounter (Signed)
   Primary Cardiologist: Candee Furbish, MD  Chart reviewed as part of pre-operative protocol coverage. Patient was contacted 07/01/2018 in reference to pre-operative risk assessment for pending surgery as outlined below.  Howard Lawrence was last seen on 04/10/18  by Dr. Marlou Porch. Since that day, CLIFFARD HAIR has done well. Walks 1.5 miles each morning without any issue.   Therefore, based on ACC/AHA guidelines, the patient would be at acceptable risk for the planned procedure without further cardiovascular testing.   Dr. Marlou Porch, can patient hold ASA and how long? Please forward your response to P CV DIV PREOP.   Thank you  Leanor Kail, PA 07/01/2018, 1:43 PM

## 2018-07-02 NOTE — Telephone Encounter (Signed)
Optimally, if he can have surgery performed on aspirin that would be great.  If not, hold aspirin for 5 days prior to surgery. Candee Furbish, MD

## 2018-08-13 ENCOUNTER — Encounter (HOSPITAL_BASED_OUTPATIENT_CLINIC_OR_DEPARTMENT_OTHER): Payer: Self-pay | Admitting: *Deleted

## 2018-08-13 ENCOUNTER — Encounter (HOSPITAL_BASED_OUTPATIENT_CLINIC_OR_DEPARTMENT_OTHER)
Admission: RE | Admit: 2018-08-13 | Discharge: 2018-08-13 | Disposition: A | Discharge status: Home / Self Care | Payer: BLUE CROSS/BLUE SHIELD | Source: Ambulatory Visit | Attending: Orthopedic Surgery | Admitting: Orthopedic Surgery

## 2018-08-13 ENCOUNTER — Other Ambulatory Visit: Payer: Self-pay

## 2018-08-13 DIAGNOSIS — Z01818 Encounter for other preprocedural examination: Secondary | ICD-10-CM | POA: Insufficient documentation

## 2018-08-13 DIAGNOSIS — M189 Osteoarthritis of first carpometacarpal joint, unspecified: Secondary | ICD-10-CM | POA: Diagnosis not present

## 2018-08-13 LAB — BASIC METABOLIC PANEL
Anion gap: 10 (ref 5–15)
BUN: 13 mg/dL (ref 6–20)
CO2: 28 mmol/L (ref 22–32)
Calcium: 9.3 mg/dL (ref 8.9–10.3)
Chloride: 103 mmol/L (ref 98–111)
Creatinine, Ser: 1.15 mg/dL (ref 0.61–1.24)
GFR calc Af Amer: >60 mL/min (ref >60)
GFR calc non Af Amer: >60 mL/min (ref >60)
Glucose, Bld: 96 mg/dL (ref 70–99)
Potassium: 4 mmol/L (ref 3.5–5.1)
SODIUM: 141 mmol/L (ref 135–145)

## 2018-08-13 NOTE — Progress Notes (Signed)
Pre op phone call done. Pt to come in for BMET - Card clearance and Asprin instructions in Epic from Dr. Marlou Porch. Chart reviewed by Dr Deatra Canter.

## 2018-08-22 ENCOUNTER — Ambulatory Visit (HOSPITAL_BASED_OUTPATIENT_CLINIC_OR_DEPARTMENT_OTHER): Payer: BLUE CROSS/BLUE SHIELD | Admitting: Anesthesiology

## 2018-08-22 ENCOUNTER — Other Ambulatory Visit: Payer: Self-pay

## 2018-08-22 ENCOUNTER — Ambulatory Visit (HOSPITAL_BASED_OUTPATIENT_CLINIC_OR_DEPARTMENT_OTHER)
Admission: RE | Admit: 2018-08-22 | Discharge: 2018-08-22 | Disposition: A | Discharge status: Home / Self Care | Payer: BLUE CROSS/BLUE SHIELD | Attending: Orthopedic Surgery | Admitting: Orthopedic Surgery

## 2018-08-22 ENCOUNTER — Encounter (HOSPITAL_BASED_OUTPATIENT_CLINIC_OR_DEPARTMENT_OTHER)
Admission: RE | Disposition: A | Discharge status: Home / Self Care | Payer: Self-pay | Source: Home / Self Care | Attending: Orthopedic Surgery

## 2018-08-22 ENCOUNTER — Encounter (HOSPITAL_BASED_OUTPATIENT_CLINIC_OR_DEPARTMENT_OTHER): Payer: Self-pay

## 2018-08-22 DIAGNOSIS — Z87891 Personal history of nicotine dependence: Secondary | ICD-10-CM | POA: Insufficient documentation

## 2018-08-22 DIAGNOSIS — I2510 Atherosclerotic heart disease of native coronary artery without angina pectoris: Secondary | ICD-10-CM | POA: Insufficient documentation

## 2018-08-22 DIAGNOSIS — I10 Essential (primary) hypertension: Secondary | ICD-10-CM | POA: Insufficient documentation

## 2018-08-22 DIAGNOSIS — M1990 Unspecified osteoarthritis, unspecified site: Secondary | ICD-10-CM | POA: Diagnosis present

## 2018-08-22 DIAGNOSIS — Z8261 Family history of arthritis: Secondary | ICD-10-CM | POA: Insufficient documentation

## 2018-08-22 DIAGNOSIS — Z955 Presence of coronary angioplasty implant and graft: Secondary | ICD-10-CM | POA: Diagnosis not present

## 2018-08-22 DIAGNOSIS — K219 Gastro-esophageal reflux disease without esophagitis: Secondary | ICD-10-CM | POA: Diagnosis not present

## 2018-08-22 DIAGNOSIS — Z801 Family history of malignant neoplasm of trachea, bronchus and lung: Secondary | ICD-10-CM | POA: Diagnosis not present

## 2018-08-22 DIAGNOSIS — M1811 Unilateral primary osteoarthritis of first carpometacarpal joint, right hand: Secondary | ICD-10-CM | POA: Insufficient documentation

## 2018-08-22 DIAGNOSIS — Z8379 Family history of other diseases of the digestive system: Secondary | ICD-10-CM | POA: Diagnosis not present

## 2018-08-22 HISTORY — PX: CARPOMETACARPEL SUSPENSION PLASTY: SHX5005

## 2018-08-22 SURGERY — CARPOMETACARPEL (CMC) SUSPENSION PLASTY
Anesthesia: Monitor Anesthesia Care | Site: Thumb | Laterality: Right

## 2018-08-22 MED ORDER — LACTATED RINGERS IV SOLN
INTRAVENOUS | Status: DC
  Administered 2018-08-22: 09:00:00 via INTRAVENOUS

## 2018-08-22 MED ORDER — PROPOFOL 500 MG/50ML IV EMUL
INTRAVENOUS | Status: DC | PRN
  Administered 2018-08-22: 100 ug/kg/min via INTRAVENOUS

## 2018-08-22 MED ORDER — FENTANYL CITRATE (PF) 100 MCG/2ML IJ SOLN
INTRAMUSCULAR | Status: AC
  Filled 2018-08-22: qty 2

## 2018-08-22 MED ORDER — CEFAZOLIN SODIUM-DEXTROSE 2-4 GM/100ML-% IV SOLN
INTRAVENOUS | Status: AC
  Filled 2018-08-22: qty 100

## 2018-08-22 MED ORDER — OXYCODONE-ACETAMINOPHEN 7.5-325 MG PO TABS: 1.0000 | ORAL_TABLET | ORAL | 0 refills | Status: AC | PRN

## 2018-08-22 MED ORDER — MIDAZOLAM HCL 2 MG/2ML IJ SOLN
INTRAMUSCULAR | Status: AC
  Filled 2018-08-22: qty 2

## 2018-08-22 MED ORDER — CEFAZOLIN SODIUM-DEXTROSE 2-4 GM/100ML-% IV SOLN
2.0000 g | INTRAVENOUS | Status: AC
  Administered 2018-08-22 (×2): 2 g via INTRAVENOUS

## 2018-08-22 MED ORDER — SCOPOLAMINE 1 MG/3DAYS TD PT72: 1.0000 | MEDICATED_PATCH | Freq: Once | TRANSDERMAL | Status: DC | PRN

## 2018-08-22 MED ORDER — PROPOFOL 10 MG/ML IV BOLUS
INTRAVENOUS | Status: DC | PRN
  Administered 2018-08-22: 20 mg via INTRAVENOUS

## 2018-08-22 MED ORDER — FENTANYL CITRATE (PF) 100 MCG/2ML IJ SOLN
50.0000 ug | INTRAMUSCULAR | Status: DC | PRN
  Administered 2018-08-22 (×2): 50 ug via INTRAVENOUS

## 2018-08-22 MED ORDER — MIDAZOLAM HCL 2 MG/2ML IJ SOLN
1.0000 mg | INTRAMUSCULAR | Status: DC | PRN
  Administered 2018-08-22: 2 mg via INTRAVENOUS
  Administered 2018-08-22: 1 mg via INTRAVENOUS

## 2018-08-22 MED ORDER — BUPIVACAINE-EPINEPHRINE (PF) 0.5% -1:200000 IJ SOLN
INTRAMUSCULAR | Status: DC | PRN
  Administered 2018-08-22: 30 mL via PERINEURAL

## 2018-08-22 MED ORDER — CHLORHEXIDINE GLUCONATE 4 % EX LIQD: 60.0000 mL | Freq: Once | CUTANEOUS | Status: DC

## 2018-08-22 SURGICAL SUPPLY — 57 items
BIT DRILL PASSING CMC 1/4 FLEX (BIT) IMPLANT
BLADE MINI RND TIP GREEN BEAV (BLADE) ×2 IMPLANT
BLADE SURG 15 STRL LF DISP TIS (BLADE) ×1 IMPLANT
BLADE SURG 15 STRL SS (BLADE) ×2
BNDG CMPR 9X4 STRL LF SNTH (GAUZE/BANDAGES/DRESSINGS) ×1
BNDG COHESIVE 3X5 TAN STRL LF (GAUZE/BANDAGES/DRESSINGS) ×2 IMPLANT
BNDG ESMARK 4X9 LF (GAUZE/BANDAGES/DRESSINGS) ×2 IMPLANT
BNDG GAUZE ELAST 4 BULKY (GAUZE/BANDAGES/DRESSINGS) ×2 IMPLANT
BUTTON ALL-SUT W/BACKSTOP (Orthopedic Implant) ×1 IMPLANT
CHLORAPREP W/TINT 26ML (MISCELLANEOUS) ×2 IMPLANT
CORD BIPOLAR FORCEPS 12FT (ELECTRODE) ×2 IMPLANT
COVER BACK TABLE 60X90IN (DRAPES) ×2 IMPLANT
COVER MAYO STAND STRL (DRAPES) ×2 IMPLANT
CUFF TOURNIQUET SINGLE 18IN (TOURNIQUET CUFF) IMPLANT
DECANTER SPIKE VIAL GLASS SM (MISCELLANEOUS) IMPLANT
DRAPE EXTREMITY T 121X128X90 (DRAPE) ×2 IMPLANT
DRAPE OEC MINIVIEW 54X84 (DRAPES) ×2 IMPLANT
DRAPE SURG 17X23 STRL (DRAPES) ×2 IMPLANT
DRILL PASSING CMC 1/4 FLEX (BIT) ×2
GAUZE 4X4 16PLY RFD (DISPOSABLE) IMPLANT
GAUZE SPONGE 4X4 12PLY STRL (GAUZE/BANDAGES/DRESSINGS) ×2 IMPLANT
GAUZE XEROFORM 1X8 LF (GAUZE/BANDAGES/DRESSINGS) ×2 IMPLANT
GLOVE BIOGEL PI IND STRL 8.5 (GLOVE) ×1 IMPLANT
GLOVE BIOGEL PI INDICATOR 8.5 (GLOVE) ×1
GLOVE SURG ORTHO 8.0 STRL STRW (GLOVE) ×2 IMPLANT
GOWN STRL REUS W/ TWL LRG LVL3 (GOWN DISPOSABLE) ×1 IMPLANT
GOWN STRL REUS W/TWL LRG LVL3 (GOWN DISPOSABLE) ×2
GOWN STRL REUS W/TWL XL LVL3 (GOWN DISPOSABLE) ×2 IMPLANT
NDL PRECISIONGLIDE 27X1.5 (NEEDLE) IMPLANT
NEEDLE PRECISIONGLIDE 27X1.5 (NEEDLE) IMPLANT
NS IRRIG 1000ML POUR BTL (IV SOLUTION) ×2 IMPLANT
PACK BASIN DAY SURGERY FS (CUSTOM PROCEDURE TRAY) ×2 IMPLANT
PAD CAST 3X4 CTTN HI CHSV (CAST SUPPLIES) ×1 IMPLANT
PADDING CAST ABS 3INX4YD NS (CAST SUPPLIES)
PADDING CAST ABS COTTON 3X4 (CAST SUPPLIES) IMPLANT
PADDING CAST COTTON 3X4 STRL (CAST SUPPLIES) ×2
SLEEVE SCD COMPRESS KNEE MED (MISCELLANEOUS) ×2 IMPLANT
SLING ARM FOAM STRAP XLG (SOFTGOODS) ×2 IMPLANT
SPLINT PLASTER CAST XFAST 3X15 (CAST SUPPLIES) IMPLANT
SPLINT PLASTER XTRA FASTSET 3X (CAST SUPPLIES)
STOCKINETTE 4X48 STRL (DRAPES) ×2 IMPLANT
SUT ETHIBOND 3-0 V-5 (SUTURE) ×2 IMPLANT
SUT ETHILON 4 0 PS 2 18 (SUTURE) ×2 IMPLANT
SUT FIBERWIRE 4-0 18 DIAM BLUE (SUTURE)
SUT MERSILENE 4 0 P 3 (SUTURE) IMPLANT
SUT STEEL 3 0 (SUTURE) ×2 IMPLANT
SUT VIC AB 4-0 P-3 18XBRD (SUTURE) IMPLANT
SUT VIC AB 4-0 P2 18 (SUTURE) IMPLANT
SUT VIC AB 4-0 P3 18 (SUTURE)
SUT VIC AB 4-0 RB1 27 (SUTURE) ×2
SUT VIC AB 4-0 RB1 27X BRD (SUTURE) ×1 IMPLANT
SUTURE FIBERWR 4-0 18 DIA BLUE (SUTURE) IMPLANT
SYR BULB 3OZ (MISCELLANEOUS) ×2 IMPLANT
SYR CONTROL 10ML LL (SYRINGE) IMPLANT
TOWEL GREEN STERILE FF (TOWEL DISPOSABLE) ×4 IMPLANT
TOWEL OR NON WOVEN STRL DISP B (DISPOSABLE) ×2 IMPLANT
UNDERPAD 30X30 (UNDERPADS AND DIAPERS) ×2 IMPLANT

## 2018-08-22 NOTE — Anesthesia Postprocedure Evaluation (Signed)
Anesthesia Post Note  Patient: Howard Lawrence  Procedure(s) Performed: SUSPENSION PLASTY RIGHT THUMB EIGHT ROPE Kramer SUPPORT WITH TENDON TRANSFER AND TRAPEZIUM EXCISION (Right Thumb)     Patient location during evaluation: PACU Anesthesia Type: Regional and MAC Level of consciousness: awake and alert Pain management: pain level controlled Vital Signs Assessment: post-procedure vital signs reviewed and stable Respiratory status: spontaneous breathing, nonlabored ventilation, respiratory function stable and patient connected to nasal cannula oxygen Cardiovascular status: stable and blood pressure returned to baseline Postop Assessment: no apparent nausea or vomiting Anesthetic complications: no    Last Vitals:  Vitals:   08/22/18 1115 08/22/18 1140  BP: 103/76 108/70  Pulse: 66 60  Resp: 14 16  Temp:  36.6 C  SpO2: 96% 96%    Last Pain:  Vitals:   08/22/18 1140  TempSrc:   PainSc: 0-No pain                 Tiajuana Amass

## 2018-08-22 NOTE — Discharge Instructions (Addendum)
[facility] Instructions Hand Surgery  Wound Care: Keep your hand elevated above the level of your heart.  Do not allow it to dangle by your side.  Keep the dressing dry and do not remove it unless your doctor advises you to do so.  He will usually change it at the time of your post-op visit.  Moving your fingers is advised to stimulate circulation but will depend on the site of your surgery.  If you have a splint applied, your doctor will advise you regarding movement.  Activity: Do not drive or operate machinery today.  Rest today and then you may return to your normal activity and work as indicated by your physician.  Diet:  Drink liquids today or eat a light diet.  You may resume a regular diet tomorrow.    General expectations: Pain for two to three days. Fingers may become slightly swollen.  Call your doctor if any of the following occur: Severe pain not relieved by pain medication. Elevated temperature. Dressing soaked with blood. Inability to move fingers. White or bluish color to fingers.   Regional Anesthesia Blocks  1. Numbness or the inability to move the "blocked" extremity may last from 3-48 hours after placement. The length of time depends on the medication injected and your individual response to the medication. If the numbness is not going away after 48 hours, call your surgeon.  2. The extremity that is blocked will need to be protected until the numbness is gone and the  Strength has returned. Because you cannot feel it, you will need to take extra care to avoid injury. Because it may be weak, you may have difficulty moving it or using it. You may not know what position it is in without looking at it while the block is in effect.  3. For blocks in the legs and feet, returning to weight bearing and walking needs to be done carefully. You will need to wait until the numbness is entirely gone and the strength has returned. You should be able to move your leg  and foot normally before you try and bear weight or walk. You will need someone to be with you when you first try to ensure you do not fall and possibly risk injury.  4. Bruising and tenderness at the needle site are common side effects and will resolve in a few days.  5. Persistent numbness or new problems with movement should be communicated to the surgeon or the [place] [phone] [place] [phone]).    Post Anesthesia Home Care Instructions  Activity: Get plenty of rest for the remainder of the day. A responsible individual must stay with you for 24 hours following the procedure.  For the next 24 hours, DO NOT: -Drive a car -Paediatric nurse -Drink alcoholic beverages -Take any medication unless instructed by your physician -Make any legal decisions or sign important papers.  Meals: Start with liquid foods such as gelatin or soup. Progress to regular foods as tolerated. Avoid greasy, spicy, heavy foods. If nausea and/or vomiting occur, drink only clear liquids until the nausea and/or vomiting subsides. Call your physician if vomiting continues.  Special Instructions/Symptoms: Your throat may feel dry or sore from the anesthesia or the breathing tube placed in your throat during surgery. If this causes discomfort, gargle with warm salt water. The discomfort should disappear within 24 hours.  If you had a scopolamine patch placed behind your ear for  the management of post- operative nausea and/or vomiting:  1. The medication in the patch is effective for 72 hours, after which it should be removed.  Wrap patch in a tissue and discard in the trash. Wash hands thoroughly with soap and water. 2. You may remove the patch earlier than 72 hours if you experience unpleasant side effects which may include dry mouth, dizziness or visual disturbances. 3. Avoid touching the patch. Wash your hands with soap and water after contact with the patch.

## 2018-08-22 NOTE — Anesthesia Procedure Notes (Addendum)
Anesthesia Regional Block: Axillary brachial plexus block   Pre-Anesthetic Checklist: ,, timeout performed, Correct Patient, Correct Site, Correct Laterality, Correct Procedure, Correct Position, site marked, Risks and benefits discussed,  Surgical consent,  Pre-op evaluation,  At surgeon's request and post-op pain management  Laterality: Right  Prep: chloraprep       Needles:  Injection technique: Single-shot  Needle Type: Echogenic Stimulator Needle     Needle Length: 9cm  Needle Gauge: 21     Additional Needles:   Procedures:, nerve stimulator,,, ultrasound used (permanent image in chart),,,,   Nerve Stimulator or Paresthesia:  Response: MC, median, radial and ulnar responses elicited, 0.5 mA,   Additional Responses:   Narrative:  Start time: 08/22/2018 8:55 AM End time: 08/22/2018 9:01 AM Injection made incrementally with aspirations every 5 mL.  Performed by: Personally  Anesthesiologist: Suzette Battiest, MD

## 2018-08-22 NOTE — Brief Op Note (Signed)
08/22/2018  10:40 AM  PATIENT:  Ladon Applebaum  57 y.o. male  PRE-OPERATIVE DIAGNOSIS:  CARPOMETACARPAL ARTHRITIS RIGHT THUMB  POST-OPERATIVE DIAGNOSIS:  CARPOMETACARPAL ARTHRITIS RIGHT THUMB  PROCEDURE:  Procedure(s): SUSPENSION PLASTY RIGHT THUMB EIGHT ROPE MICROLINK SUPPORT WITH TENDON TRANSFER AND TRAPEZIUM EXCISION (Right)  SURGEON:  Surgeon(s) and Role:    * Daryll Brod, MD - Primary  PHYSICIAN ASSISTANT:   ASSISTANTS: Richardo Priest, MD  ANESTHESIA:   regional and general  EBL:  2 mL   BLOOD ADMINISTERED:none  DRAINS: none   LOCAL MEDICATIONS USED:  NONE  SPECIMEN:  No Specimen  DISPOSITION OF SPECIMEN:  N/A  COUNTS:  YES  TOURNIQUET:   Total Tourniquet Time Documented: Upper Arm (Right) - 51 minutes Total: Upper Arm (Right) - 51 minutes   DICTATION: .Viviann Spare Dictation  PLAN OF CARE: Discharge to home after PACU  PATIENT DISPOSITION:  PACU - hemodynamically stable.

## 2018-08-22 NOTE — Op Note (Signed)
NAME: FODAY CONE MEDICAL RECORD NO: [id number] DATE OF BIRTH: [birth year] FACILITY: [facility] LOCATION: [facility] PHYSICIAN: Wynonia Sours, MD   OPERATIVE REPORT   DATE OF PROCEDURE: 08/22/18    PREOPERATIVE DIAGNOSIS:   Carpometacarpal arthritis right thumb   POSTOPERATIVE DIAGNOSIS:   Same   PROCEDURE:   Suspension plasty with transfer FCR tendon excision trapezium placement of micro link tight rope   SURGEON: Daryll Brod, M.D.   ASSISTANT: Leanora Cover, MD   ANESTHESIA:  General with regional   INTRAVENOUS FLUIDS:  Per anesthesia flow sheet.   ESTIMATED BLOOD LOSS:  Minimal.   COMPLICATIONS:  None.   SPECIMENS:  none   TOURNIQUET TIME:    Total Tourniquet Time Documented: Upper Arm (Right) - 51 minutes Total: Upper Arm (Right) - 51 minutes    DISPOSITION:  Stable to PACU.   INDICATIONS: Patient is a 57 year old male with a history of pain at the basilar joint of his thumb with significant degenerative changes on x-ray of carpometacarpal arthritis.  This is not responded to conservative treatment.  He has elected undergo surgical reconstruction with excision of the trapezium tendon transfer suspension plasty with micro link support.  He is aware that there is no guarantee to the surgery the possibility of infection recurrence injury to arteries nerves tendons complete relief symptoms dystrophy.  In the preoperative area the patient seen the extremity marked by both patient and surgeon antibiotic given  OPERATIVE COURSE: Following adequate anesthesia of the regional block under the direction the anesthesia department.  The patient was brought to the operating room placed in the supine position prepped and draped using ChloraPrep and a three-minute dry time was allowed timeout taken to confirm patient procedure.  A general anesthetic was also given.  The limb was exsanguinated with an Esmarch bandage turn placed on the arm was inflated to 250 mmHg.  A  longitudinal incision was made over the radial aspect of the basilar joint of the thumb trapezium carried down through subcutaneous tissue bleeders were electrocauterized with bipolar.  Radial sensory nerves were protected.  The interval between the abductor pollicis longus extensor pollicis brevis was then opened.  This allowed visualization of the carpometacarpal joint which showed significant arthritis.  Dissection was carried proximally and with blunt sharp dissection the radial artery vena comitans were Eiad and identified proximally.  The STT joint was then opened.  Using periosteal elevators Beaver blades the periosteum was then removed from the trapezium which was removed in piecemeal fashion.  Taken to remove any osteophytes present.  The flexor carpi radialis tendon was then pulled into the wound.  With flexion of the wrist and adequate portion was identified one half was taken.  This was left attached distally.  The micro link tube was then placed through the base of the thumb metacarpal and the base of the index metacarpal after dissection down to the bone on each side an incision was made at the egress of the guidepin on the second metacarpal.  An incision made carried down through subcutaneous tissue the micro link suture was then passed through with a right angle hemostat maintained between the base of the index and thumb metacarpal the the tight rope was then tightened after confirmation of its positioning with AP lateral and oblique x-rays.  The wound was copiously irrigated with saline.  The flexor carpi radialis tendon half was then woven through the abductor pollicis longus and back onto itself and sutured into position with multiple  3-0 Ethibond sutures.  X-rays confirmed adequate suspension of the thumb there is excellent range of motion with no impingement.  Clued retropulsion antegrade flexion and rotation.  The wound was again irrigated the capsule closed with figure-of-eight 4-0 Vicryl  sutures.  Subcutaneous tissue with interrupted 4-0 Vicryl and skin and each wound repaired with interrupted 4-0 nylon sutures.  A sterile compressive dressing dorsal palmar thumb spica splint was applied.  Inflation of the tourniquet all fingers immediately pink.  He was taken to the recovery room for observation in satisfactory condition.  He will be discharged home to return [place] in 1 week on Percocet   Daryll Brod, MD Electronically signed, 08/22/18

## 2018-08-22 NOTE — Progress Notes (Addendum)
Assisted Dr. Rodman Comp with right, ultrasound guided, axillary block. Side rails up, monitors on throughout procedure. See vital signs in flow sheet. Tolerated Procedure well.

## 2018-08-22 NOTE — H&P (Signed)
Howard Lawrence is an 57 y.o. male.   Chief Complaint: Right thumb basal joint NWG:NFAO is a 57 year old right-hand-dominant male referred by Dr. Rex Kras for consultation regarding pain in his right thumb area. This been going on for at least a year. It is sharp in nature with a VAS score of 8/10 if he uses it on a regular basis 3/10. He has no prior history of injury. He localizes around the area of his thumb basilar area. He has seen Dr. Grandville Silos in the past and has had 2 injections occasion and little to no relief. He was discussed with the use of a suspension plasty L RTI tendon transfer with bone excision is wondering if anything else can be done. He has been taking gabapentin. And directions. He has been wearing splints. He has a history of a stent and is on aspirin for this. He has a history of arthritis no history of diabetes thyroid problems or gout. Family history is negative for each of these also. States he has looked up on the Internet and seen that there is a steel ball that can be placed in their and is wondering if something else is available.He has had this injected he is undergone splinting anti-inflammatories bracing but continues to have plaint pain discomfort in his line of work per he states that gripping and pinching increases pain at the present time not doing anything it is 4/10 but increases with use    Past Medical History:  Diagnosis Date  . Coronary artery disease   . GERD (gastroesophageal reflux disease)   . Hypertension   . Ulcerative colitis [facility])     Past Surgical History:  Procedure Laterality Date  . CORONARY STENT PLACEMENT  05/08/2014   DES to LAD         Dr Irish Lack  . LEFT HEART CATHETERIZATION WITH CORONARY ANGIOGRAM N/A 05/08/2014   Procedure: LEFT HEART CATHETERIZATION WITH CORONARY ANGIOGRAM;  Surgeon: Candee Furbish, MD;  Location: [facility] CATH LAB;  Service: Cardiovascular;  Laterality: N/A;  . PERCUTANEOUS CORONARY STENT INTERVENTION (PCI-S)  05/08/2014   Procedure: PERCUTANEOUS CORONARY STENT INTERVENTION (PCI-S);  Surgeon: Candee Furbish, MD;  Location: [facility] CATH LAB;  Service: Cardiovascular;;  . skin cancer removal from right side of face      Family History  Problem Relation Age of Onset  . Arthritis/Rheumatoid Mother   . Liver disease Father   . Lung cancer Paternal Grandmother   . Heart attack Neg Hx   . Stroke Neg Hx    Social History:  reports that he quit smoking about 4 years ago. His smoking use included cigarettes. He started smoking about 4 years ago. He has a 30.00 pack-year smoking history. He has never used smokeless tobacco. He reports that he does not drink alcohol or use drugs.  Allergies: No Known Allergies  No medications prior to admission.    No results found for this or any previous visit (from the past 48 hour(s)).  No results found.   Pertinent items are noted in HPI.  Height 5' 10"  (1.778 m), weight 104.3 kg.  General appearance: alert, cooperative and appears stated age Head: Normocephalic, without obvious abnormality Neck: no JVD Resp: clear to auscultation bilaterally Cardio: regular rate and rhythm, S1, S2 normal, no murmur, click, rub or gallop GI: soft, non-tender; bowel sounds normal; no masses,  no organomegaly Extremities: Basilar joint right thumb pain Pulses: 2+ and symmetric Skin: Skin color, texture, turgor normal. No rashes or lesions Neurologic: Grossly normal  Incision/Wound: na  Assessment/Plan Assessment:  1. Primary osteoarthritis of both first carpometacarpal joints    Plan: He would like to I have this operated on at this point in time. Pre-peri-and postoperative course been discussed along with risk complications. He is aware that there is no guarantee to the surgery the possibility of infection recurrence injury to arteries nerves tendons complete relief symptoms dystrophy. He is scheduled for a suspension plasty to trapezium excision of the ligament transfer and micro-link. He  is aware that we are trying to remove his much pain is possible but make no guarantees of complete removal of all of his pain and discomfort. Questions are encouraged and answered to his satisfaction. The schedule as an outpatient under regional anesthesia right thumb   Daryll Brod 08/22/2018, 5:19 AM

## 2018-08-22 NOTE — Anesthesia Preprocedure Evaluation (Addendum)
Anesthesia Evaluation  Patient identified by MRN, date of birth, ID band Patient awake    Reviewed: Allergy & Precautions, NPO status , Patient's Chart, lab work & pertinent test results  Airway Mallampati: II  TM Distance: >3 FB Neck ROM: Full    Dental  (+) Dental Advisory Given   Pulmonary former smoker,    breath sounds clear to auscultation       Cardiovascular hypertension, Pt. on medications and Pt. on home beta blockers + CAD and + Cardiac Stents   Rhythm:Regular Rate:Normal     Neuro/Psych negative neurological ROS     GI/Hepatic Neg liver ROS, PUD, GERD  ,  Endo/Other  negative endocrine ROS  Renal/GU negative Renal ROS     Musculoskeletal  (+) Arthritis ,   Abdominal   Peds  Hematology negative hematology ROS (+)   Anesthesia Other Findings   Reproductive/Obstetrics                             Anesthesia Physical Anesthesia Plan  ASA: III  Anesthesia Plan: MAC and Regional   Post-op Pain Management:    Induction: Intravenous  PONV Risk Score and Plan: 1 and Propofol infusion, Ondansetron and Treatment may vary due to age or medical condition  Airway Management Planned: Natural Airway and Simple Face Mask  Additional Equipment:   Intra-op Plan:   Post-operative Plan:   Informed Consent: I have reviewed the patients History and Physical, chart, labs and discussed the procedure including the risks, benefits and alternatives for the proposed anesthesia with the patient or authorized representative who has indicated his/her understanding and acceptance.   Dental advisory given  Plan Discussed with: CRNA  Anesthesia Plan Comments:         Anesthesia Quick Evaluation

## 2018-08-22 NOTE — Transfer of Care (Signed)
Immediate Anesthesia Transfer of Care Note  Patient: Howard Lawrence  Procedure(s) Performed: SUSPENSION PLASTY RIGHT THUMB EIGHT ROPE [place] SUPPORT WITH TENDON TRANSFER AND TRAPEZIUM EXCISION (Right Thumb)  Patient Location: PACU  Anesthesia Type:MAC combined with regional for post-op pain  Level of Consciousness: awake, alert , oriented and drowsy  Airway & Oxygen Therapy: Patient Spontanous Breathing and Patient connected to face mask oxygen  Post-op Assessment: Report given to RN and Post -op Vital signs reviewed and stable  Post vital signs: Reviewed and stable  Last Vitals:  Vitals Value Taken Time  BP    Temp    Pulse 70 08/22/2018 10:48 AM  Resp 13 08/22/2018 10:48 AM  SpO2 97 % 08/22/2018 10:48 AM  Vitals shown include unvalidated device data.  Last Pain:  Vitals:   08/22/18 0837  TempSrc: Oral  PainSc: 2          Complications: No apparent anesthesia complications

## 2018-08-23 ENCOUNTER — Encounter (HOSPITAL_BASED_OUTPATIENT_CLINIC_OR_DEPARTMENT_OTHER): Payer: Self-pay | Admitting: Orthopedic Surgery

## 2018-08-23 NOTE — Op Note (Signed)
I assisted Surgeon(s) and Role:    * Daryll Brod, MD - Primary on the Procedure(s): SUSPENSION PLASTY RIGHT THUMB EIGHT ROPE [place] WITH TENDON TRANSFER AND TRAPEZIUM EXCISION on 08/22/2018.  I provided assistance on this case as follows: retraction soft tissues, removal trapezium, passage suture and anchor.  Electronically signed by: Leanora Cover, MD Date: 08/23/2018 Time: 3:32 PM

## 2018-11-18 ENCOUNTER — Other Ambulatory Visit: Payer: Self-pay

## 2018-11-18 MED ORDER — LOSARTAN POTASSIUM-HCTZ 50-12.5 MG PO TABS: 1.0000 | ORAL_TABLET | Freq: Every day | ORAL | 2 refills | Status: AC

## 2019-02-27 ENCOUNTER — Telehealth: Payer: Self-pay | Admitting: *Deleted

## 2019-02-27 ENCOUNTER — Encounter: Payer: Self-pay | Admitting: *Deleted

## 2019-02-27 DIAGNOSIS — Z006 Encounter for examination for normal comparison and control in clinical research program: Secondary | ICD-10-CM

## 2019-02-27 NOTE — Telephone Encounter (Signed)
Left message for patient to call the research office to complete the last follow up phone call (5 year) for the BIOFLOW V research study.

## 2019-02-27 NOTE — Research (Signed)
BIOFLOW V Research study 5 year telephone follow up completed. Patient denies any Adverse events or changes in medications (reviewed current epic list) He denies any chest pain. I thanks him for his participation in the study and informed him that the Stent has been approved.  This concludes the research  follow up.

## 2019-03-23 ENCOUNTER — Other Ambulatory Visit: Payer: Self-pay | Admitting: Cardiology

## 2019-05-23 ENCOUNTER — Other Ambulatory Visit: Payer: Self-pay | Admitting: *Deleted

## 2019-05-23 DIAGNOSIS — Z122 Encounter for screening for malignant neoplasm of respiratory organs: Secondary | ICD-10-CM

## 2019-05-23 DIAGNOSIS — Z87891 Personal history of nicotine dependence: Secondary | ICD-10-CM

## 2019-06-02 ENCOUNTER — Ambulatory Visit
Admission: RE | Admit: 2019-06-02 | Discharge: 2019-06-02 | Disposition: A | Discharge status: Home / Self Care | Payer: Self-pay | Source: Ambulatory Visit | Attending: Acute Care | Admitting: Acute Care

## 2019-06-02 ENCOUNTER — Other Ambulatory Visit: Payer: Self-pay

## 2019-06-02 DIAGNOSIS — Z122 Encounter for screening for malignant neoplasm of respiratory organs: Secondary | ICD-10-CM

## 2019-06-02 DIAGNOSIS — Z87891 Personal history of nicotine dependence: Secondary | ICD-10-CM

## 2019-06-05 ENCOUNTER — Telehealth: Payer: Self-pay | Admitting: Acute Care

## 2019-06-05 DIAGNOSIS — Z122 Encounter for screening for malignant neoplasm of respiratory organs: Secondary | ICD-10-CM

## 2019-06-05 DIAGNOSIS — Z87891 Personal history of nicotine dependence: Secondary | ICD-10-CM

## 2019-06-05 NOTE — Telephone Encounter (Signed)
Pt informed of CT results per Sarah Groce, NP.  PT verbalized understanding.  Copy sent to PCP.  Order placed for 1 yr f/u CT.  

## 2019-06-12 ENCOUNTER — Inpatient Hospital Stay: Admission: RE | Admit: 2019-06-12 | Payer: BLUE CROSS/BLUE SHIELD | Source: Ambulatory Visit

## 2020-06-01 ENCOUNTER — Ambulatory Visit: Payer: No Typology Code available for payment source

## 2020-06-07 ENCOUNTER — Telehealth: Payer: Self-pay | Admitting: Acute Care

## 2020-06-07 NOTE — Telephone Encounter (Signed)
LMTC x 1  

## 2020-06-07 NOTE — Telephone Encounter (Signed)
Please call patient and let them  know their  low dose Ct was read as a Lung RADS 1, negative study: no nodules or definitely benign nodules. Radiology recommendation is for a repeat LDCT in 12 months..Please let them  know we will order and schedule their  annual screening scan for 05/2021. Please let them  know there was notation of CAD on their  scan.  Please remind the patient  that this is a non-gated exam therefore degree or severity of disease  cannot be determined. Please have them  follow up with their PCP regarding potential risk factor modification, dietary therapy or pharmacologic therapy if clinically indicated. Pt.  is  currently on statin therapy. Please place order for annual  screening scan for  05/2021 and fax results to PCP. Thanks so much.

## 2020-06-09 ENCOUNTER — Telehealth: Payer: Self-pay | Admitting: Acute Care

## 2020-06-09 DIAGNOSIS — Z87891 Personal history of nicotine dependence: Secondary | ICD-10-CM

## 2020-06-10 NOTE — Telephone Encounter (Signed)
LMTC x 1  

## 2020-06-10 NOTE — Telephone Encounter (Signed)
See telephone note 06/09/20.

## 2020-06-10 NOTE — Telephone Encounter (Signed)
Pt informed of CT results per Eric Form, NP.  PT verbalized understanding.  Copy sent to PCP.  Order placed for 1 yr f/u CT. See telephone note 06/07/20.

## 2021-02-26 IMAGING — CT CT CHEST LUNG CANCER SCREENING LOW DOSE W/O CM
1 series · 10 of 10 positions shown, 13 images · non-contrast
Comparison: 05/31/2018.

CLINICAL DATA: Former smoker, quit 5 years ago, 30 pack-year
history.

EXAM:
CT CHEST WITHOUT CONTRAST LOW-DOSE FOR LUNG CANCER SCREENING
TECHNIQUE: Multidetector CT imaging of the chest was performed following the
standard protocol without IV contrast.

[ct lung segmentation data · axial · 0.90mm/px · z∈[-407,-407]mm · 10 of 358 frames shown]
[frame 1/358  mediastinal]
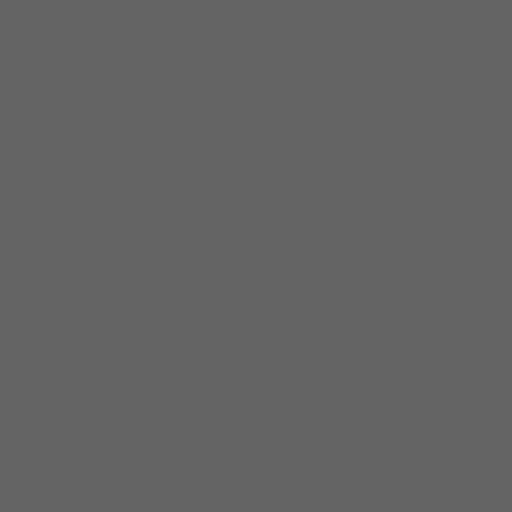
[frame 1/358  lung]
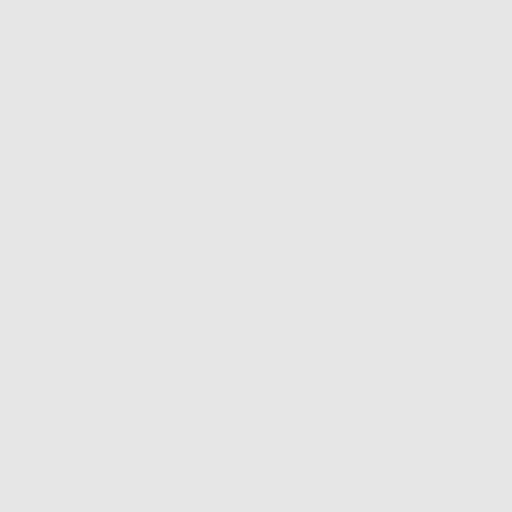
[frame 40/358  lung]
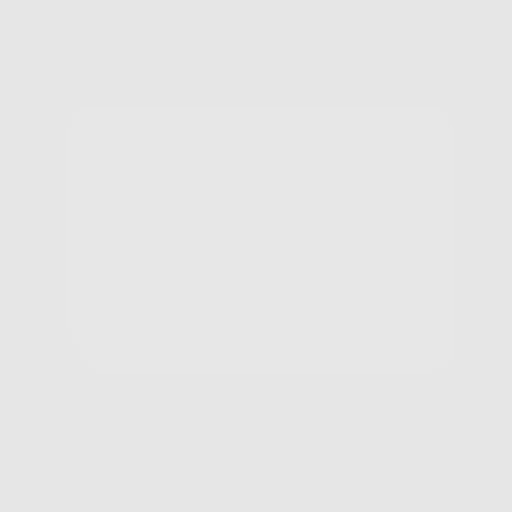
[frame 80/358  lung]
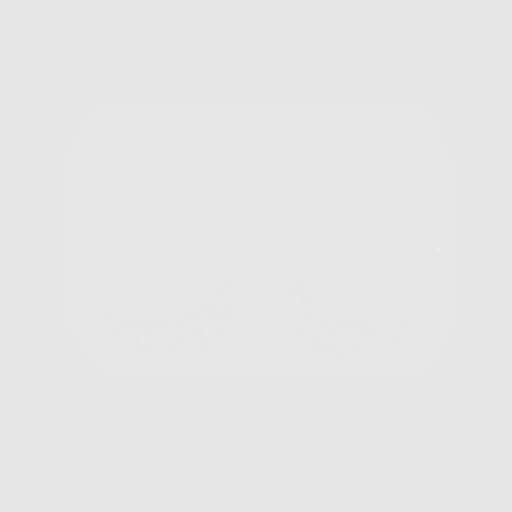
[frame 120/358  lung]
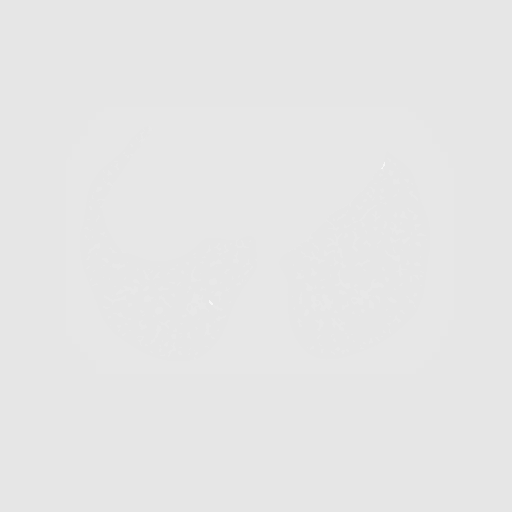
[frame 159/358  mediastinal]
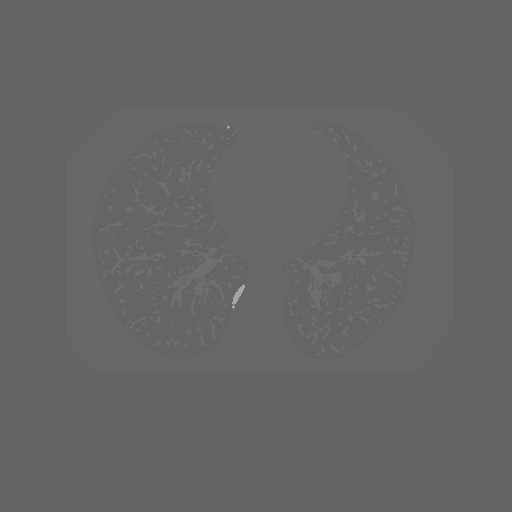
[frame 159/358  lung]
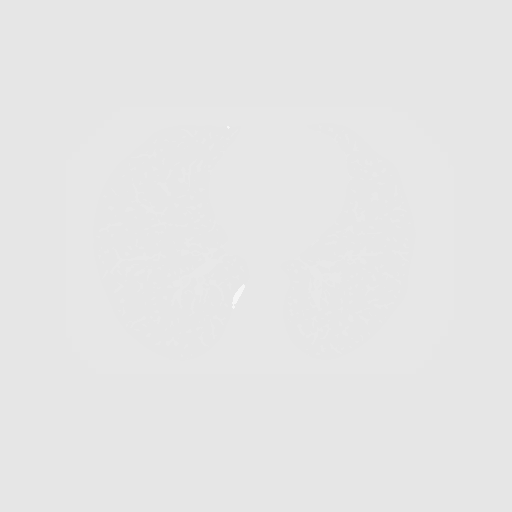
[frame 199/358  lung]
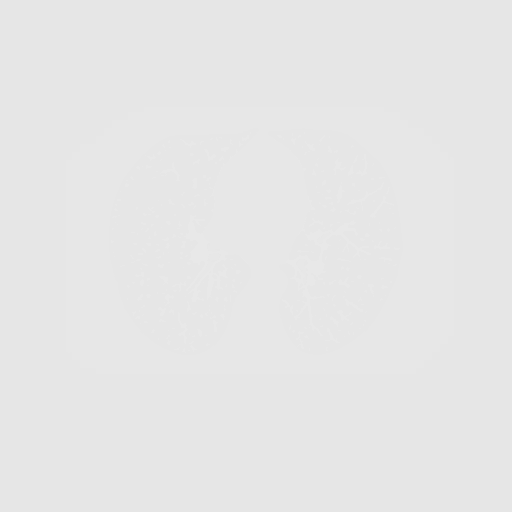
[frame 239/358  lung]
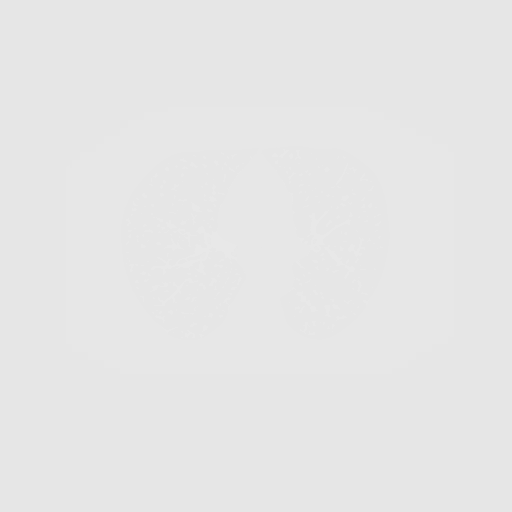
[frame 278/358  lung]
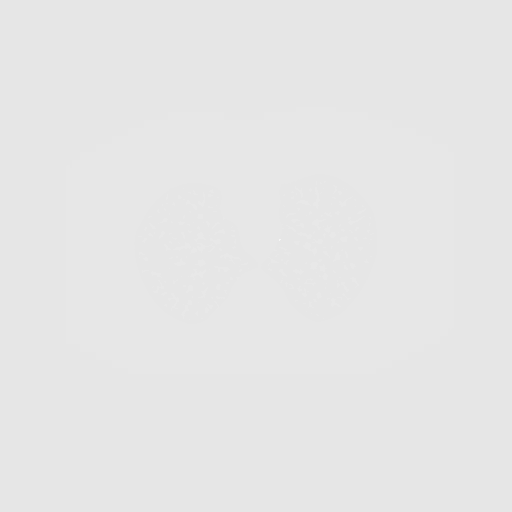
[frame 318/358  mediastinal]
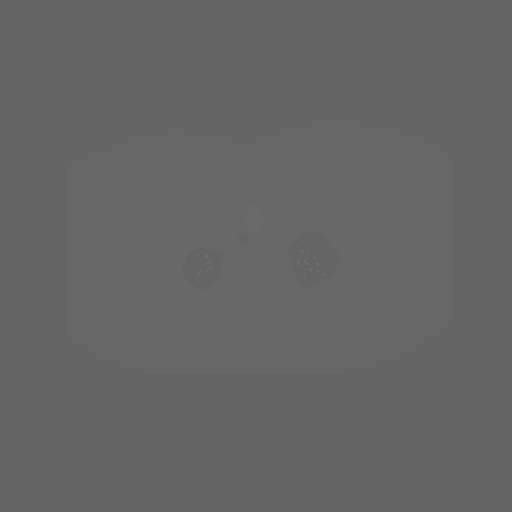
[frame 318/358  lung]
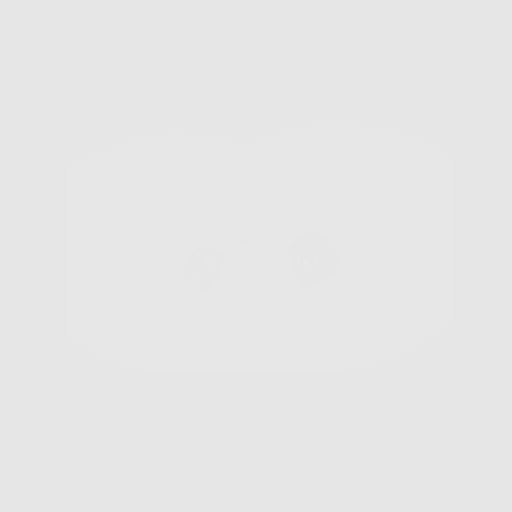
[frame 358/358  lung]
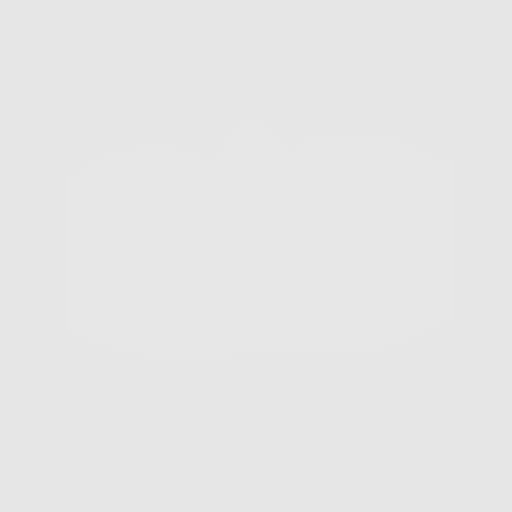

[10 of 10 positions shown; findings below may reference images not displayed]

FINDINGS: Cardiovascular: Atherosclerotic calcification of the aorta and
coronary arteries. Heart size normal. No pericardial effusion.

Mediastinum/Nodes: No pathologically enlarged mediastinal or
axillary lymph nodes. Hilar regions are difficult to definitively
evaluate without IV contrast but appear grossly unremarkable.
Esophagus is unremarkable.

Lungs/Pleura: No worrisome pulmonary nodules. No pleural fluid.
There is a tracheal diverticulum. Airway is otherwise unremarkable.

Upper Abdomen: Visualized portions of the liver, gallbladder,
adrenal glands, kidneys, spleen, pancreas, stomach and bowel are
grossly unremarkable. No upper abdominal adenopathy.

Musculoskeletal: Degenerative changes in the spine. No worrisome
lytic or sclerotic lesions. L1 compression fracture is unchanged.
IMPRESSION: 1. Lung-RADS 1, negative. Continue annual screening with low-dose
chest CT without contrast in 12 months.
2. Aortic atherosclerosis ([id number]-170.0). Coronary artery
calcification.
